# Patient Record
Sex: Male | Born: 1940 | Race: White | Hispanic: No | Smoking: Never smoker
Health system: Southern US, Community
[De-identification: ages and names within clinical notes are randomized; demographics above are authoritative.]

## PROBLEM LIST (undated history)

## (undated) DIAGNOSIS — I10 Essential (primary) hypertension: Secondary | ICD-10-CM

## (undated) DIAGNOSIS — R7303 Prediabetes: Secondary | ICD-10-CM

## (undated) DIAGNOSIS — C801 Malignant (primary) neoplasm, unspecified: Secondary | ICD-10-CM

## (undated) DIAGNOSIS — H269 Unspecified cataract: Secondary | ICD-10-CM

## (undated) DIAGNOSIS — C61 Malignant neoplasm of prostate: Secondary | ICD-10-CM

## (undated) DIAGNOSIS — E785 Hyperlipidemia, unspecified: Secondary | ICD-10-CM

## (undated) HISTORY — DX: Essential (primary) hypertension: I10

## (undated) HISTORY — DX: Malignant neoplasm of prostate: C61

## (undated) HISTORY — DX: Malignant (primary) neoplasm, unspecified: C80.1

## (undated) HISTORY — PX: PROSTATE SURGERY: SHX751

## (undated) HISTORY — PX: POLYPECTOMY: SHX149

## (undated) HISTORY — PX: COLONOSCOPY: SHX174

## (undated) HISTORY — PX: OTHER SURGICAL HISTORY: SHX169

---

## 1997-12-02 ENCOUNTER — Emergency Department (HOSPITAL_COMMUNITY): Admission: EM | Admit: 1997-12-02 | Discharge: 1997-12-02 | Payer: Self-pay | Admitting: Emergency Medicine

## 1999-08-11 ENCOUNTER — Other Ambulatory Visit: Admission: RE | Admit: 1999-08-11 | Discharge: 1999-08-11 | Payer: Self-pay | Admitting: Urology

## 2000-08-16 ENCOUNTER — Other Ambulatory Visit: Admission: RE | Admit: 2000-08-16 | Discharge: 2000-08-16 | Payer: Self-pay | Admitting: Urology

## 2000-08-20 ENCOUNTER — Encounter: Admission: RE | Admit: 2000-08-20 | Discharge: 2000-11-18 | Payer: Self-pay | Admitting: Radiation Oncology

## 2000-11-30 ENCOUNTER — Encounter: Payer: Self-pay | Admitting: Urology

## 2000-12-07 ENCOUNTER — Encounter (INDEPENDENT_AMBULATORY_CARE_PROVIDER_SITE_OTHER): Payer: Self-pay | Admitting: Specialist

## 2000-12-07 ENCOUNTER — Inpatient Hospital Stay (HOSPITAL_COMMUNITY): Admission: RE | Admit: 2000-12-07 | Discharge: 2000-12-10 | Payer: Self-pay | Admitting: Urology

## 2006-10-22 ENCOUNTER — Ambulatory Visit: Payer: Self-pay | Admitting: Gastroenterology

## 2006-11-05 ENCOUNTER — Ambulatory Visit: Payer: Self-pay | Admitting: Gastroenterology

## 2006-11-05 ENCOUNTER — Encounter: Payer: Self-pay | Admitting: Gastroenterology

## 2010-04-28 ENCOUNTER — Ambulatory Visit (HOSPITAL_COMMUNITY)
Admission: RE | Admit: 2010-04-28 | Discharge: 2010-04-29 | Payer: Self-pay | Source: Home / Self Care | Admitting: Surgery

## 2010-08-12 LAB — CBC
HCT: 41.9 % (ref 39.0–52.0)
Hemoglobin: 14.7 g/dL (ref 13.0–17.0)
MCH: 31.4 pg (ref 26.0–34.0)
MCHC: 35.2 g/dL (ref 30.0–36.0)
MCV: 89.3 fL (ref 78.0–100.0)
Platelets: 273 10*3/uL (ref 150–400)
RBC: 4.69 MIL/uL (ref 4.22–5.81)
RDW: 13.4 % (ref 11.5–15.5)
WBC: 14.9 10*3/uL — ABNORMAL HIGH (ref 4.0–10.5)

## 2010-08-12 LAB — BASIC METABOLIC PANEL
BUN: 16 mg/dL (ref 6–23)
CO2: 24 mEq/L (ref 19–32)
Calcium: 9.1 mg/dL (ref 8.4–10.5)
Chloride: 102 mEq/L (ref 96–112)
Creatinine, Ser: 1.15 mg/dL (ref 0.4–1.5)
GFR calc Af Amer: >60 mL/min (ref >60)
GFR calc non Af Amer: >60 mL/min (ref >60)
Glucose, Bld: 109 mg/dL — ABNORMAL HIGH (ref 70–99)
Potassium: 4 mEq/L (ref 3.5–5.1)
Sodium: 136 mEq/L (ref 135–145)

## 2010-08-12 LAB — CULTURE, ROUTINE-ABSCESS

## 2010-08-12 LAB — SURGICAL PCR SCREEN
MRSA, PCR: NEGATIVE
Staphylococcus aureus: NEGATIVE

## 2010-10-17 NOTE — Op Note (Signed)
[facility]  Patient:    Theodore Dominguez, Theodore Dominguez                          MRN: [id number] Proc. Date: 12/07/00 Adm. Date:  [id number] Attending:  Nelma Rothman Dominguez CC:         Theodore Dominguez, M.D.  Theodore Dominguez, M.D.   Operative Report  PREOPERATIVE DIAGNOSIS:  Adenocarcinoma of the prostate.  POSTOPERATIVE DIAGNOSIS:  Adenocarcinoma of the prostate.  OPERATION:  Radical retropubic prostatectomy, potency sparing.  SURGEON:  Theodore Dominguez, M.D.  ASSISTANT:  Theodore Dominguez, M.D.  ANESTHESIA:  General endotracheal anesthesia.  ESTIMATED BLOOD LOSS: 500 cc.  TUBES:  23-French 5 cc Ralene Ok, large flat Blake drain.  COMPLICATIONS:  None.  INDICATIONS FOR PROCEDURE:  Theodore Dominguez is a very nice 70 year old white male who presented with elevated PSA of 4.58 with a free-to-total ratio of 23.14%. He subsequently underwent biopsies which revealed some atypical glands and high-grade pin, and a repeat biopsy on August 16, 2000, revealed focal adenocarcinoma of the prostate, Gleason Score 6 which was 3+3 involving 15% of the right side of the prostate.  He has considered all options, met with Theodore Dominguez and Theodore Dominguez.  He has elected to undergo radical retropubic prostatectomy.   He full well understands the risks, benefits, and alternatives with risks including impotency, incontinence, rectal injury, bleeding, infection, and other potential problems.  He has thoroughly studied the illness and wishes to proceed.  PROCEDURE IN DETAIL:  The patient was placed in the supine position after proper general endotracheal anesthesia.  He was prepped and draped with Betadine in a sterile fashion.  A low midline abdominal incision was made. Sharp dissection was carried down through subcutaneous tissue.  The linea alba was incised in the direction of the muscle fibers.  The rectus muscle bellies were retracted laterally, and the space of Retzius was  entered.  No abnormal adenopathy was noted to be present.  Utilizing the [facility] retractor, bilateral pelvic lymphadenectomies were performed.   The external iliac, hypogastric, and obturator lymph nodes were serially dissected.   The proximal extend of the dissection were the ureters.  The distal extent were the circumflex iliac veins.  The obturator nerves were identified and protected, and lateral lymph nodes were avoided.  Following this, endopelvic fascia was perforated bilaterally, and the puboprostatic ligaments were incised sharply. The superficial dorsal vein of the penis was ligated proximally and distally with 3-0 chromic ligatures, cut, and the deep dorsal vein of the penis was encircled with a Hohenfeltner retractor and ligated with #1 Vicryl with a tie and subsequently a stick tie.  The dorsal vein complex was then incised sharply with Bovie coagulation cautery after an ampule of indigo carmine had bee given IV.  The pillars of the prostate were dissected down laterally after the lateral pelvic fascia had been sharply incised, allowing the neurovascular bundles to retract posteriorly and laterally.  Dissection was carried down to the apex.  The urethra was completely encircled with a Hohenfeltner retractor. An umbilical tape was placed, and the anterior urethra was incised sharply with a knife.  It might be noted that a 24-French 30 cc balloon Foley had been placed previously and the bladder drained.  The Foley was used as a traction device, and the posterior urethra was sharply incised with scissors down to the umbilical plane.  The rectourethralis muscles were then serially taken  down to the Denonvillier fascia which was dissected posteriorly.  The pedicles of the prostate were then approached and taken in packets and clipped with Hem-o-lok clips and taken to the posterior margin of the prostate.  The transversalis fascia was then incised.  The seminal vesicles and  ampulla of vas deferens were dissected free, and the bladder neck was then approached. It was incised sharply with Bovie coagulation cautery.  A large median bar was noted to be protruding into the bladder, and the ureteral orifices were identified with the help of the indigo carmine.  Sharp dissection as carried down around the bladder neck.  The entire prostate was excised and submitted to pathology.  The seminal vesicles were dissected out in totality along with ampulla of vas deferens, and this was submitted separately from the prostate. Irrigation was performed and good hemostasis noted to be present.  The mucosa of the bladder was plicated to the serosa with interrupted 5-0 chromic catgut, and a tennis racket type closure was performed with 3-0 chromic catgut, everting the well visualized ureteral orifices well within the bladder. Again, efflux of urine was noted after the closure of the bladder neck.  Over a 23-French 5 cc balloon catheter, the urethrovesicle anastomosis was then completed.  Stitches were placed in the 5 oclock, 7 oclock, 3 oclock, 9 oclock, and 12 oclock positions with 2-0 Vicryl suture utilizing UR5 needles, and then anastomosis was completed over it after a #1 Prolene had been placed through the eyelet of the catheter, exited, and sutured over a button as a safety stitch.  The bladder was thoroughly irrigated.  Good hemostasis was noted to be present.  There was no leakage of irrigation fluid. Again, irrigation was performed.  A large, flat Blake drain was placed through a left stab incision and sutured in place with nylon suture.  The rectus muscle bellies were approximated in the midline with interrupted 0 chromic catgut.  The fascia was closed with a #1 running PDS suture.  Irrigation of the subcutaneous tissue was performed, and the skin was closed with skin staples and dressed sterilely.  The patient tolerated the procedure well with no complications.  He was  taken to the recovery room in stable condition.                                                                             y DD:  12/07/00 TD:  12/07/00 Job: [id number]

## 2010-10-17 NOTE — Discharge Summary (Signed)
[facility]  Patient:    Theodore Dominguez, Theodore Dominguez                          MRN: [id number] Adm. Date:  [id number] Disc. Date: [id number] Attending:  Nelma Rothman Iii CC:         Claudette Laws, M.D.   Discharge Summary  DIAGNOSIS:  Adenocarcinoma of the prostate.  OPERATIVE PROCEDURE:  Radical retropubic prostatectomy on December 07, 2000.  HISTORY OF PRESENT ILLNESS:  Theodore Dominguez is a very nice 70 year old white male who presented with an elevated PSA of 4.58.  He subsequently underwent transrectal and biopsy of the prostate which revealed a Gleason score 6 adenocarcinoma on the right side.  He has considered all options and elected to undergo radical retropubic prostatectomy.  Past medical history, social history, family history, and review of systems: Please see history and physical in full detail.  PHYSICAL EXAMINATION:  VITAL SIGNS:  He is afebrile, vital signs stable.  GENERAL:  Well nourished, well developed, in no acute distress, oriented x 3.  HEENT:  Head, ear, nose, and throat PERRLA.  Ear, nose, and throat clear.  NECK:  Without mass or thyromegaly.  CHEST:  Clear anterior to posterior without rales or rhonchi.  ABDOMEN:  Soft, nontender, without mass or organomegaly.  HEART:  Normal sinus rhythm, without murmurs or gallops.  GU:  Penis, meatus, scrotum, testicle, adnexa, anus, and perineum were normal. Rectal vault is empty.  Sphincter tone is normal.  Prostate is 30 g, diffusely firm.  EXTREMITIES:  Normal.  NEUROLOGIC:  Intact.  HOSPITAL COURSE:  Patient was admitted.  After undergoing proper preoperative evaluation, he was subsequently taken to surgery on December 07, 2000, and underwent radical retropubic prostatectomy uneventfully.  Postoperatively, on December 07, 2000, he was doing very well and, on December 08, 2000, was afebrile, vital signs stable.  Blake output was modest.  Hemoglobin was 11.1, hematocrit 32.1, and white blood cell count  was 13.0.  BMET was essentially normal except for a low potassium of 2.4 and he was given Joyce Gross Ciel IV and p.o.  By December 09, 2000, he was tolerating clear liquids, bowel movements were noted, abdomen was soft.  He was increased to a 4 g sodium diet and subsequently discharged on December 10, 2000.  His potassium was 3.4.  Pathology was discussed in detail.  He was tolerating a diet, having bowel movements.  Discharge condition was improved.  Discharge medications were Cipro and Vicodin.  He was to follow up in a week and instructions were given. DD:  12/28/00 TD:  12/29/00 Job: [id number]

## 2010-10-17 NOTE — H&P (Signed)
[facility]  Patient:    Theodore Dominguez, Theodore Dominguez                          MRN: [id number] Adm. Date:  [id number] Attending:  Nelma Rothman Iii CC:         Theodore Dominguez, M.D.   History and Physical  CHIEF COMPLAINT:  I have prostate cancer.  HISTORY OF PRESENT ILLNESS:  Theodore Dominguez is a very nice 70 year old white male who was found to have an elevated PSA of 4.58, 23.14% free to total ratio.  He underwent biopsy of the prostate originally which revealed some atypical glands and high grade PIN.  Subsequent biopsy on August 16, 2000 revealed focal adenocarcinoma of the prostate Gleason score 6 which was 3+3 involving 15% of the right side of the prostate.  He has considered all options carefully, has met with Dr. Kathrin Greathouse, radiotherapist, and has elected to proceed with radical retropubic prostatectomy.  ALLERGIES:  No known drug allergies.  MEDICATIONS: 1. Maxzide 25 mg p.o. q.d. 2. Aspirin one q.d. but last dose was November 29, 2000.  PAST MEDICAL HISTORY:  Hypertension.  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  PAST SURGICAL HISTORY:  None previously.  SOCIAL HISTORY:  Negative smoker.  Negative drinker.  FAMILY HISTORY:  Essentially nonpertinent, although there is some high blood pressure and a distant history of prostate cancer.  REVIEW OF SYSTEMS:  He has no shortness of breath, dyspnea with exertion, chest pain, or GI complaints.  He has had some urgency with urination, but really has decent flow.  PHYSICAL EXAMINATION  VITAL SIGNS:  Blood pressure 120/90, pulse 62, respirations 16, temperature 97.5 degrees Fahrenheit.  GENERAL:  Well-nourished, well-developed, in no acute distress, oriented x 3.  HEENT:  PERRLA.  Nose and throat:  Clear.  NECK:  Without masses or thyromegaly or bruits.  CHEST:  Clear anterior and posterior without rales or rhonchi.  HEART:  Normal sinus rhythm without murmurs or  gallops.  ABDOMEN:  Soft, nontender without masses, organomegaly, or hernias.  GENITOURINARY:  Penis, meatus, scrotum, testicles, adnexa, anus, and perineum are normal.  RECTAL:  Vault is empty.  Sphincter tone is normal.  No rectal masses are palpable.  Prostate is approximately 30 g, diffusely slightly firm, but essentially benign.  EXTREMITIES:  Normal.  NEUROLOGIC:  Intact.  IMPRESSION:  Clinical stage T1 C Gleason score 6 adenocarcinoma of the prostate.  PLAN:  Radical retropubic prostatectomy. DD:  12/07/00 TD:  12/07/00 Job: [id number]

## 2011-09-15 ENCOUNTER — Encounter: Payer: Self-pay | Admitting: Gastroenterology

## 2011-09-25 ENCOUNTER — Encounter: Payer: Self-pay | Admitting: Gastroenterology

## 2011-10-27 ENCOUNTER — Ambulatory Visit (AMBULATORY_SURGERY_CENTER): Payer: BC Managed Care – PPO

## 2011-10-27 VITALS — Ht 70.0 in | Wt 192.6 lb

## 2011-10-27 DIAGNOSIS — Z86010 Personal history of colonic polyps: Secondary | ICD-10-CM

## 2011-10-27 DIAGNOSIS — Z1211 Encounter for screening for malignant neoplasm of colon: Secondary | ICD-10-CM

## 2011-10-27 MED ORDER — PEG-KCL-NACL-NASULF-NA ASC-C 100 G PO SOLR: 1.0000 | Freq: Once | ORAL | Status: AC

## 2011-10-28 ENCOUNTER — Encounter: Payer: Self-pay | Admitting: Gastroenterology

## 2011-11-10 ENCOUNTER — Encounter: Payer: Self-pay | Admitting: Gastroenterology

## 2011-11-10 ENCOUNTER — Ambulatory Visit (AMBULATORY_SURGERY_CENTER): Payer: BC Managed Care – PPO | Admitting: Gastroenterology

## 2011-11-10 VITALS — BP 153/72 | HR 67 | Temp 97.5°F | Resp 29 | Ht 70.0 in | Wt 192.0 lb

## 2011-11-10 DIAGNOSIS — Z860100 Personal history of colon polyps, unspecified: Secondary | ICD-10-CM

## 2011-11-10 DIAGNOSIS — D126 Benign neoplasm of colon, unspecified: Secondary | ICD-10-CM

## 2011-11-10 DIAGNOSIS — Z1211 Encounter for screening for malignant neoplasm of colon: Secondary | ICD-10-CM

## 2011-11-10 MED ORDER — SODIUM CHLORIDE 0.9 % IV SOLN: 500.0000 mL | INTRAVENOUS | Status: DC

## 2011-11-10 NOTE — Progress Notes (Signed)
Patient did not have preoperative order for IV antibiotic SSI prophylaxis. (G8918)  Patient did not experience any of the following events: a burn prior to discharge; a fall within the facility; wrong site/side/patient/procedure/implant event; or a hospital transfer or hospital admission upon discharge from the facility. (G8907)  

## 2011-11-10 NOTE — Op Note (Signed)
[facility] [address] [place], [place]  [address]  COLONOSCOPY PROCEDURE REPORT  PATIENT:  Theodore Dominguez, Theodore Dominguez  MR#:  [id number] BIRTHDATE:  [birth year], 71 yrs. old  GENDER:  male ENDOSCOPIST:  Barbette Hair. Arlyce Dice, MD REF. BY:  Carolin Coy, M.D. PROCEDURE DATE:  11/10/2011 PROCEDURE:  Colonoscopy with snare polypectomy, Colon with cold biopsy polypectomy ASA CLASS:  Class II INDICATIONS:  Screening, history of pre-cancerous (adenomatous) colon polyps Index polypectomy 2008 MEDICATIONS:   MAC sedation, administered by CRNA propofol 200mg IV  DESCRIPTION OF PROCEDURE:   After the risks benefits and alternatives of the procedure were thoroughly explained, informed consent was obtained.  Digital rectal exam was performed and revealed no abnormalities.   The LB [id number]-[id number] endoscope was introduced through the anus and advanced to the cecum, which was identified by both the appendix and ileocecal valve, without limitations.  The quality of the prep was excellent, using MoviPrep.  The instrument was then slowly withdrawn as the colon was fully examined. <<PROCEDUREIMAGES>>  FINDINGS:  A sessile polyp was found in the cecum. It was 6 mm in size. Polyp was snared without cautery. Retrieval was successful (see image2). snare polyp  A sessile polyp was found in the cecum. It was 2 mm in size. The polyp was removed using cold biopsy forceps (see image6).  A sessile polyp was found in the sigmoid colon. It was 3 mm in size. It was found 15 cm from the point of entry. Polyp was snared without cautery. Retrieval was successful (see image6). snare polyp  This was otherwise a normal examination of the colon (see image5 and image7).   Retroflexed views in the rectum revealed no abnormalities.    The time to cecum =  1) 3.25 minutes. The scope was then withdrawn in  1) 10.0  minutes from the cecum and the procedure completed. COMPLICATIONS:  None ENDOSCOPIC IMPRESSION: 1) 6 mm  sessile polyp in the cecum 2) 2 mm sessile polyp in the cecum 3) 3 mm sessile polyp in the sigmoid colon 4) Otherwise normal examination RECOMMENDATIONS: 1) If the polyp(s) removed today are proven to be adenomatous (pre-cancerous) polyps, you will need a repeat colonoscopy in 5 years. Otherwise you should continue to follow colorectal cancer screening guidelines for "routine risk" patients with colonoscopy in 10 years. You will receive a letter within 1-2 weeks with the results of your biopsy as well as final recommendations. Please call my office if you have not received a letter after 3 weeks. REPEAT EXAM:   You will receive a letter from Dr. Arlyce Dice in 1-2 weeks, after reviewing the final pathology, with followup recommendations.  ______________________________ Barbette Hair Arlyce Dice, MD  CC:  n. eSIGNED:   Barbette Hair. Rowan Pollman at 11/10/2011 10:13 AM  Myna Bright, [id number]

## 2011-11-10 NOTE — Patient Instructions (Signed)
YOU HAD AN ENDOSCOPIC PROCEDURE TODAY AT THE [facility]: Refer to the procedure report that was given to you for any specific questions about what was found during the examination.  If the procedure report does not answer your questions, please call your gastroenterologist to clarify.  If you requested that your care partner not be given the details of your procedure findings, then the procedure report has been included in a sealed envelope for you to review at your convenience later.  YOU SHOULD EXPECT: Some feelings of bloating in the abdomen. Passage of more gas than usual.  Walking can help get rid of the air that was put into your GI tract during the procedure and reduce the bloating. If you had a lower endoscopy (such as a colonoscopy or flexible sigmoidoscopy) you may notice spotting of blood in your stool or on the toilet paper. If you underwent a bowel prep for your procedure, then you may not have a normal bowel movement for a few days.  DIET: Your first meal following the procedure should be a light meal and then it is ok to progress to your normal diet.  A half-sandwich or bowl of soup is an example of a good first meal.  Heavy or fried foods are harder to digest and may make you feel nauseous or bloated.  Likewise meals heavy in dairy and vegetables can cause extra gas to form and this can also increase the bloating.  Drink plenty of fluids but you should avoid alcoholic beverages for 24 hours.  ACTIVITY: Your care partner should take you home directly after the procedure.  You should plan to take it easy, moving slowly for the rest of the day.  You can resume normal activity the day after the procedure however you should NOT DRIVE or use heavy machinery for 24 hours (because of the sedation medicines used during the test).    SYMPTOMS TO REPORT IMMEDIATELY: A gastroenterologist can be reached at any hour.  During normal business hours, 8:30 AM to 5:00 PM Monday through Friday,  call [phone].  After hours and on weekends, please call the GI answering service at [phone] who will take a message and have the physician on call contact you.   Following lower endoscopy (colonoscopy or flexible sigmoidoscopy):  Excessive amounts of blood in the stool  Significant tenderness or worsening of abdominal pains  Swelling of the abdomen that is new, acute  Fever of 100F or higher    FOLLOW UP: If any biopsies were taken you will be contacted by phone or by letter within the next 1-3 weeks.  Call your gastroenterologist if you have not heard about the biopsies in 3 weeks.  Our staff will call the home number listed on your records the next business day following your procedure to check on you and address any questions or concerns that you may have at that time regarding the information given to you following your procedure. This is a courtesy call and so if there is no answer at the home number and we have not heard from you through the emergency physician on call, we will assume that you have returned to your regular daily activities without incident.  SIGNATURES/CONFIDENTIALITY: You and/or your care partner have signed paperwork which will be entered into your electronic medical record.  These signatures attest to the fact that that the information above on your After Visit Summary has been reviewed and is understood.  Full responsibility of the confidentiality   of this discharge information lies with you and/or your care-partner.     

## 2011-11-11 ENCOUNTER — Telehealth: Payer: Self-pay

## 2011-11-11 NOTE — Telephone Encounter (Signed)
Request not to leave message on answering machine.

## 2011-11-16 ENCOUNTER — Encounter: Payer: Self-pay | Admitting: Gastroenterology

## 2013-07-10 DIAGNOSIS — Z23 Encounter for immunization: Secondary | ICD-10-CM | POA: Diagnosis not present

## 2013-07-10 DIAGNOSIS — C61 Malignant neoplasm of prostate: Secondary | ICD-10-CM | POA: Diagnosis not present

## 2013-07-10 DIAGNOSIS — Z0000 Encounter for general adult medical examination without abnormal findings: Secondary | ICD-10-CM | POA: Diagnosis not present

## 2013-07-10 DIAGNOSIS — E7800 Pure hypercholesterolemia, unspecified: Secondary | ICD-10-CM | POA: Diagnosis not present

## 2013-07-10 DIAGNOSIS — I10 Essential (primary) hypertension: Secondary | ICD-10-CM | POA: Diagnosis not present

## 2013-07-13 DIAGNOSIS — R7309 Other abnormal glucose: Secondary | ICD-10-CM | POA: Diagnosis not present

## 2013-07-20 DIAGNOSIS — R351 Nocturia: Secondary | ICD-10-CM | POA: Diagnosis not present

## 2013-07-20 DIAGNOSIS — Z8546 Personal history of malignant neoplasm of prostate: Secondary | ICD-10-CM | POA: Diagnosis not present

## 2013-07-20 DIAGNOSIS — N529 Male erectile dysfunction, unspecified: Secondary | ICD-10-CM | POA: Diagnosis not present

## 2013-08-15 DIAGNOSIS — Z9109 Other allergy status, other than to drugs and biological substances: Secondary | ICD-10-CM | POA: Diagnosis not present

## 2013-09-12 DIAGNOSIS — L84 Corns and callosities: Secondary | ICD-10-CM | POA: Diagnosis not present

## 2013-09-12 DIAGNOSIS — S91309A Unspecified open wound, unspecified foot, initial encounter: Secondary | ICD-10-CM | POA: Diagnosis not present

## 2013-09-13 DIAGNOSIS — Z0000 Encounter for general adult medical examination without abnormal findings: Secondary | ICD-10-CM | POA: Diagnosis not present

## 2013-09-13 DIAGNOSIS — S91309A Unspecified open wound, unspecified foot, initial encounter: Secondary | ICD-10-CM | POA: Diagnosis not present

## 2013-09-14 DIAGNOSIS — M205X9 Other deformities of toe(s) (acquired), unspecified foot: Secondary | ICD-10-CM | POA: Diagnosis not present

## 2013-09-14 DIAGNOSIS — L97509 Non-pressure chronic ulcer of other part of unspecified foot with unspecified severity: Secondary | ICD-10-CM | POA: Diagnosis not present

## 2013-09-14 DIAGNOSIS — E1169 Type 2 diabetes mellitus with other specified complication: Secondary | ICD-10-CM | POA: Diagnosis not present

## 2013-09-28 DIAGNOSIS — E1169 Type 2 diabetes mellitus with other specified complication: Secondary | ICD-10-CM | POA: Diagnosis not present

## 2013-09-28 DIAGNOSIS — M205X9 Other deformities of toe(s) (acquired), unspecified foot: Secondary | ICD-10-CM | POA: Diagnosis not present

## 2013-09-28 DIAGNOSIS — L97509 Non-pressure chronic ulcer of other part of unspecified foot with unspecified severity: Secondary | ICD-10-CM | POA: Diagnosis not present

## 2013-10-12 DIAGNOSIS — M205X9 Other deformities of toe(s) (acquired), unspecified foot: Secondary | ICD-10-CM | POA: Diagnosis not present

## 2013-10-12 DIAGNOSIS — E1169 Type 2 diabetes mellitus with other specified complication: Secondary | ICD-10-CM | POA: Diagnosis not present

## 2013-10-12 DIAGNOSIS — M2010 Hallux valgus (acquired), unspecified foot: Secondary | ICD-10-CM | POA: Diagnosis not present

## 2013-10-12 DIAGNOSIS — L97509 Non-pressure chronic ulcer of other part of unspecified foot with unspecified severity: Secondary | ICD-10-CM | POA: Diagnosis not present

## 2013-10-16 DIAGNOSIS — M205X9 Other deformities of toe(s) (acquired), unspecified foot: Secondary | ICD-10-CM | POA: Diagnosis not present

## 2013-10-16 DIAGNOSIS — M2010 Hallux valgus (acquired), unspecified foot: Secondary | ICD-10-CM | POA: Diagnosis not present

## 2013-10-16 DIAGNOSIS — E1169 Type 2 diabetes mellitus with other specified complication: Secondary | ICD-10-CM | POA: Diagnosis not present

## 2013-10-16 DIAGNOSIS — L97509 Non-pressure chronic ulcer of other part of unspecified foot with unspecified severity: Secondary | ICD-10-CM | POA: Diagnosis not present

## 2013-10-28 DIAGNOSIS — L97509 Non-pressure chronic ulcer of other part of unspecified foot with unspecified severity: Secondary | ICD-10-CM | POA: Diagnosis not present

## 2013-10-28 DIAGNOSIS — E1169 Type 2 diabetes mellitus with other specified complication: Secondary | ICD-10-CM | POA: Diagnosis not present

## 2013-11-27 ENCOUNTER — Emergency Department (HOSPITAL_COMMUNITY)
Admission: EM | Admit: 2013-11-27 | Discharge: 2013-11-27 | Disposition: A | Discharge status: Home / Self Care | Payer: Medicare Other | Attending: Emergency Medicine | Admitting: Emergency Medicine

## 2013-11-27 ENCOUNTER — Emergency Department (HOSPITAL_COMMUNITY): Payer: Medicare Other

## 2013-11-27 ENCOUNTER — Encounter (HOSPITAL_COMMUNITY): Payer: Self-pay | Admitting: Emergency Medicine

## 2013-11-27 DIAGNOSIS — L03119 Cellulitis of unspecified part of limb: Secondary | ICD-10-CM | POA: Diagnosis not present

## 2013-11-27 DIAGNOSIS — Z7982 Long term (current) use of aspirin: Secondary | ICD-10-CM | POA: Diagnosis not present

## 2013-11-27 DIAGNOSIS — L03039 Cellulitis of unspecified toe: Secondary | ICD-10-CM | POA: Diagnosis not present

## 2013-11-27 DIAGNOSIS — L03031 Cellulitis of right toe: Secondary | ICD-10-CM

## 2013-11-27 DIAGNOSIS — Z8546 Personal history of malignant neoplasm of prostate: Secondary | ICD-10-CM | POA: Diagnosis not present

## 2013-11-27 DIAGNOSIS — Z79899 Other long term (current) drug therapy: Secondary | ICD-10-CM | POA: Insufficient documentation

## 2013-11-27 DIAGNOSIS — M7989 Other specified soft tissue disorders: Secondary | ICD-10-CM | POA: Diagnosis not present

## 2013-11-27 DIAGNOSIS — I10 Essential (primary) hypertension: Secondary | ICD-10-CM | POA: Diagnosis not present

## 2013-11-27 DIAGNOSIS — L02619 Cutaneous abscess of unspecified foot: Secondary | ICD-10-CM | POA: Diagnosis not present

## 2013-11-27 LAB — CBC WITH DIFFERENTIAL/PLATELET
BASOS ABS: 0 10*3/uL (ref 0.0–0.1)
BASOS PCT: 0 % (ref 0–1)
Eosinophils Absolute: 0.5 10*3/uL (ref 0.0–0.7)
Eosinophils Relative: 5 % (ref 0–5)
HCT: 41.7 % (ref 39.0–52.0)
Hemoglobin: 14.2 g/dL (ref 13.0–17.0)
LYMPHS PCT: 30 % (ref 12–46)
Lymphs Abs: 3 10*3/uL (ref 0.7–4.0)
MCH: 29.2 pg (ref 26.0–34.0)
MCHC: 34.1 g/dL (ref 30.0–36.0)
MCV: 85.8 fL (ref 78.0–100.0)
MONO ABS: 0.8 10*3/uL (ref 0.1–1.0)
Monocytes Relative: 8 % (ref 3–12)
NEUTROS ABS: 5.7 10*3/uL (ref 1.7–7.7)
Neutrophils Relative %: 57 % (ref 43–77)
PLATELETS: 259 10*3/uL (ref 150–400)
RBC: 4.86 MIL/uL (ref 4.22–5.81)
RDW: 13.6 % (ref 11.5–15.5)
WBC: 10.1 10*3/uL (ref 4.0–10.5)

## 2013-11-27 LAB — BASIC METABOLIC PANEL
BUN: 19 mg/dL (ref 6–23)
CALCIUM: 9.7 mg/dL (ref 8.4–10.5)
CHLORIDE: 101 meq/L (ref 96–112)
CO2: 29 meq/L (ref 19–32)
CREATININE: 0.99 mg/dL (ref 0.50–1.35)
GFR calc non Af Amer: 79 mL/min — ABNORMAL LOW (ref >90)
Glucose, Bld: 99 mg/dL (ref 70–99)
Potassium: 3.9 mEq/L (ref 3.7–5.3)
SODIUM: 141 meq/L (ref 137–147)

## 2013-11-27 MED ORDER — CLINDAMYCIN HCL 150 MG PO CAPS: 300.0000 mg | ORAL_CAPSULE | Freq: Four times a day (QID) | ORAL | Status: DC

## 2013-11-27 NOTE — ED Notes (Signed)
Bed: AC16 Expected date:  Expected time:  Means of arrival:  Comments: Theodore Dominguez

## 2013-11-27 NOTE — ED Notes (Signed)
Pt went to podiatrist in April.  Callous which opened up.  Has had antibiotics.  Pt told it would take 2 weeks to heal.  4 weeks later, wound opened and oozing with blisters.  Not improving.

## 2013-11-27 NOTE — ED Notes (Signed)
Pt reports R foot callus came loose and had started to become sore.  Has been to see his doctor and was put on abx last week and seemed to get better.  Recently, his foot started to become worse and have developed blisters.  What appears to be a pressure ulcer is noted on pt's R medial foot.  Redness and swelling noted.

## 2013-11-27 NOTE — ED Provider Notes (Signed)
CSN: [id number]     Arrival date & time 11/27/13  1230 History   First MD Initiated Contact with Patient 11/27/13 1549     Chief Complaint  Patient presents with  . Foot Swelling     (Consider location/radiation/quality/duration/timing/severity/associated sxs/prior Treatment) HPI Comments: Patient presents emergency department with chief complaint of right foot ulceration and rash. He states that he has been seeing a podiatrist for the past several months. He states that he had a callus, which opened up, and has been being treated with clindamycin and levofloxacin. He states that antibiotics helped, but did not completely alleviate his symptoms. He states that this morning when he awoke, he noticed increased swelling, blistering, and discharge to the foot. He states that the foot is nontender. He is able to ambulate appropriately. He denies any fevers, chills, nausea, or vomiting. He has not have a history of diabetes.  The history is provided by the patient. No language interpreter was used.    Past Medical History  Diagnosis Date  . Hypertension   . Cancer   . Prostate cancer     2002   Past Surgical History  Procedure Laterality Date  . Prostate surgery    . Colonoscopy    . Polypectomy    . Cyst on head      back of head/ 2011   Family History  Problem Relation Age of Onset  . Colon cancer Neg Hx    History  Substance Use Topics  . Smoking status: Never Smoker   . Smokeless tobacco: Never Used  . Alcohol Use: No    Review of Systems  All other systems reviewed and are negative.     Allergies  Review of patient's allergies indicates no known allergies.  Home Medications   Prior to Admission medications   Medication Sig Start Date End Date Taking? Authorizing Deara Bober  Ascorbic Acid (VITAMIN C) 1000 MG tablet Take 1,000 mg by mouth daily.    Yes Historical Tahjai Schetter, MD  aspirin 81 MG tablet Take 81 mg by mouth daily.   Yes Historical Treesa Mccully, MD   calcium-vitamin D (OSCAL WITH D) 500-200 MG-UNIT per tablet Take 2 tablets by mouth daily with breakfast.   Yes Historical Zephaniah Enyeart, MD  fish oil-omega-3 fatty acids 1000 MG capsule Take 2 g by mouth daily.   Yes Historical Elecia Serafin, MD  lisinopril-hydrochlorothiazide (PRINZIDE,ZESTORETIC) 20-25 MG per tablet Take 1 tablet by mouth daily.   Yes Historical Maleeyah Mccaughey, MD  metoprolol succinate (TOPROL-XL) 50 MG 24 hr tablet Take 50 mg by mouth daily. Take with or immediately following a meal.   Yes Historical Renso Swett, MD  Multiple Vitamin (MULTIVITAMIN) tablet Take 1 tablet by mouth daily. Senior MVI-Take one daily   Yes Historical Rosie Torrez, MD  vitamin E 400 UNIT capsule Take 400 Units by mouth daily.   Yes Historical Kabrea Seeney, MD   BP 153/83  Pulse 60  Temp(Src) 97.7 F (36.5 C) (Oral)  Resp 18  SpO2 100% Physical Exam  Nursing note and vitals reviewed. Constitutional: He is oriented to person, place, and time. He appears well-developed and well-nourished.  HENT:  Head: Normocephalic and atraumatic.  Eyes: Conjunctivae and EOM are normal. Pupils are equal, round, and reactive to light. Right eye exhibits no discharge. Left eye exhibits no discharge. No scleral icterus.  Neck: Normal range of motion. Neck supple. No JVD present.  Cardiovascular: Normal rate, regular rhythm, normal heart sounds and intact distal pulses.  Exam reveals no gallop and no friction rub.  No murmur heard. Intact distal pulses with brisk capillary refill  Pulmonary/Chest: Effort normal and breath sounds normal. No respiratory distress. He has no wheezes. He has no rales. He exhibits no tenderness.  Abdominal: Soft. He exhibits no distension and no mass. There is no tenderness. There is no rebound and no guarding.  Musculoskeletal: Normal range of motion. He exhibits no edema and no tenderness.  Range of motion and strength 5/5  Neurological: He is alert and oriented to person, place, and time.  Sensation intact   Skin: Skin is warm and dry.  Right foot remarkable for a 2 x 2 centimeter ulceration along the medial great toe, with surrounding erythema, blistering, and moderate discharge  Psychiatric: He has a normal mood and affect. His behavior is normal. Judgment and thought content normal.    ED Course  Procedures (including critical care time) Results for orders placed during the hospital encounter of 11/27/13  CBC WITH DIFFERENTIAL      Result Value Ref Range   WBC 10.1  4.0 - 10.5 K/uL   RBC 4.86  4.22 - 5.81 MIL/uL   Hemoglobin 14.2  13.0 - 17.0 g/dL   HCT 41.7  39.0 - 52.0 %   MCV 85.8  78.0 - 100.0 fL   MCH 29.2  26.0 - 34.0 pg   MCHC 34.1  30.0 - 36.0 g/dL   RDW 13.6  11.5 - 15.5 %   Platelets 259  150 - 400 K/uL   Neutrophils Relative % 57  43 - 77 %   Neutro Abs 5.7  1.7 - 7.7 K/uL   Lymphocytes Relative 30  12 - 46 %   Lymphs Abs 3.0  0.7 - 4.0 K/uL   Monocytes Relative 8  3 - 12 %   Monocytes Absolute 0.8  0.1 - 1.0 K/uL   Eosinophils Relative 5  0 - 5 %   Eosinophils Absolute 0.5  0.0 - 0.7 K/uL   Basophils Relative 0  0 - 1 %   Basophils Absolute 0.0  0.0 - 0.1 K/uL  BASIC METABOLIC PANEL      Result Value Ref Range   Sodium 141  137 - 147 mEq/L   Potassium 3.9  3.7 - 5.3 mEq/L   Chloride 101  96 - 112 mEq/L   CO2 29  19 - 32 mEq/L   Glucose, Bld 99  70 - 99 mg/dL   BUN 19  6 - 23 mg/dL   Creatinine, Ser 0.99  0.50 - 1.35 mg/dL   Calcium 9.7  8.4 - 10.5 mg/dL   GFR calc non Af Amer 79 (*) >90 mL/min   GFR calc Af Amer >90  >90 mL/min   Dg Foot Complete Right  11/27/2013   CLINICAL DATA:  Foot swelling with nonhealing wound of the distal first metatarsal  EXAM: RIGHT FOOT COMPLETE - 3+ VIEW  COMPARISON:  None.  FINDINGS: There is no evidence of fracture or dislocation. There is soft tissue ulceration and swelling of the medial aspect of the first metatarsal phalangeal joint area. There is no underlying bony destruction to suggest osteomyelitis. Calcification at the  Achilles tendon insertion to the calcaneus is noted.  IMPRESSION: There is soft tissue ulceration and swelling of the medial aspect of the first metatarsal phalangeal joint area. There is no underlying bony destruction to suggest osteomyelitis.   Electronically Signed   By: Abelardo Diesel M.D.   On: 11/27/2013 14:41     Imaging Review Dg Foot Complete  Right  11/27/2013   CLINICAL DATA:  Foot swelling with nonhealing wound of the distal first metatarsal  EXAM: RIGHT FOOT COMPLETE - 3+ VIEW  COMPARISON:  None.  FINDINGS: There is no evidence of fracture or dislocation. There is soft tissue ulceration and swelling of the medial aspect of the first metatarsal phalangeal joint area. There is no underlying bony destruction to suggest osteomyelitis. Calcification at the Achilles tendon insertion to the calcaneus is noted.  IMPRESSION: There is soft tissue ulceration and swelling of the medial aspect of the first metatarsal phalangeal joint area. There is no underlying bony destruction to suggest osteomyelitis.   Electronically Signed   By: Abelardo Diesel M.D.   On: 11/27/2013 14:41     EKG Interpretation None      MDM   Final diagnoses:  Cellulitis of toe of right foot    Patient with cellulitis of right great. He was being treated with antibiotics, however this was approximately one month ago. Given the length of time between treatment, did not feel this is failing outpatient therapy, and we'll prescribe clindamycin and discharged home with wound recheck in 2 days. Patient seen by and discussed with Dr. Tawnya Crook, who agrees with the plan. Patient is stable and ready for discharge. He understands and agrees with plan as well.    Montine Circle, PA-C 11/27/13 1734

## 2013-11-27 NOTE — Discharge Instructions (Signed)
Cellulitis Cellulitis is an infection of the skin and the tissue beneath it. The infected area is usually red and tender. Cellulitis occurs most often in the arms and lower legs.  CAUSES  Cellulitis is caused by bacteria that enter the skin through cracks or cuts in the skin. The most common types of bacteria that cause cellulitis are Staphylococcus and Streptococcus. SYMPTOMS   Redness and warmth.  Swelling.  Tenderness or pain.  Fever. DIAGNOSIS  Your caregiver can usually determine what is wrong based on a physical exam. Blood tests may also be done. TREATMENT  Treatment usually involves taking an antibiotic medicine. HOME CARE INSTRUCTIONS   Take your antibiotics as directed. Finish them even if you start to feel better.  Keep the infected arm or leg elevated to reduce swelling.  Apply a warm cloth to the affected area up to 4 times per day to relieve pain.  Only take over-the-counter or prescription medicines for pain, discomfort, or fever as directed by your caregiver.  Keep all follow-up appointments as directed by your caregiver. SEEK MEDICAL CARE IF:   You notice red streaks coming from the infected area.  Your red area gets larger or turns dark in color.  Your bone or joint underneath the infected area becomes painful after the skin has healed.  Your infection returns in the same area or another area.  You notice a swollen bump in the infected area.  You develop new symptoms. SEEK IMMEDIATE MEDICAL CARE IF:   You have a fever.  You feel very sleepy.  You develop vomiting or diarrhea.  You have a general ill feeling (malaise) with muscle aches and pains. MAKE SURE YOU:   Understand these instructions.  Will watch your condition.  Will get help right away if you are not doing well or get worse. Document Released: 02/25/2005 Document Revised: 11/17/2011 Document Reviewed: 08/03/2011 [employer] Patient Information 2015 [employer]. This information is  not intended to replace advice given to you by your health care provider. Make sure you discuss any questions you have with your health care provider.  

## 2013-11-28 NOTE — ED Provider Notes (Signed)
Medical screening examination/treatment/procedure(s) were conducted as a shared visit with non-physician practitioner(s) and myself.  I personally evaluated the patient during the encounter.   EKG Interpretation None      Pt presents w/ inc redness, swelling, warmth of R foot. Was on abx 2 weeks ago for wound infection. No current systemic s/sx of infection including no fever, chills, nml WBC. I feel pt can be tried as an outpt w/ PO abx and can return to ED or PCP in 2 days for wound check.   Neta Ehlers, MD 11/28/13 [phone]

## 2013-11-30 ENCOUNTER — Emergency Department (HOSPITAL_COMMUNITY)
Admission: EM | Admit: 2013-11-30 | Discharge: 2013-11-30 | Disposition: A | Discharge status: Home / Self Care | Payer: Medicare Other | Attending: Emergency Medicine | Admitting: Emergency Medicine

## 2013-11-30 ENCOUNTER — Encounter (HOSPITAL_COMMUNITY): Payer: Self-pay | Admitting: Emergency Medicine

## 2013-11-30 DIAGNOSIS — Z9889 Other specified postprocedural states: Secondary | ICD-10-CM | POA: Insufficient documentation

## 2013-11-30 DIAGNOSIS — Z79899 Other long term (current) drug therapy: Secondary | ICD-10-CM | POA: Insufficient documentation

## 2013-11-30 DIAGNOSIS — L89892 Pressure ulcer of other site, stage 2: Secondary | ICD-10-CM

## 2013-11-30 DIAGNOSIS — Z859 Personal history of malignant neoplasm, unspecified: Secondary | ICD-10-CM | POA: Diagnosis not present

## 2013-11-30 DIAGNOSIS — Z792 Long term (current) use of antibiotics: Secondary | ICD-10-CM | POA: Diagnosis not present

## 2013-11-30 DIAGNOSIS — L8992 Pressure ulcer of unspecified site, stage 2: Secondary | ICD-10-CM | POA: Insufficient documentation

## 2013-11-30 DIAGNOSIS — L89899 Pressure ulcer of other site, unspecified stage: Secondary | ICD-10-CM | POA: Diagnosis not present

## 2013-11-30 DIAGNOSIS — I10 Essential (primary) hypertension: Secondary | ICD-10-CM | POA: Diagnosis not present

## 2013-11-30 DIAGNOSIS — Z8546 Personal history of malignant neoplasm of prostate: Secondary | ICD-10-CM | POA: Insufficient documentation

## 2013-11-30 DIAGNOSIS — Z7982 Long term (current) use of aspirin: Secondary | ICD-10-CM | POA: Insufficient documentation

## 2013-11-30 NOTE — ED Notes (Signed)
Pt coming in today for check up for his cellulitis on his right foot.  Pt was seen here on 11/27/13 where he was given antibiotics and still taking them.

## 2013-11-30 NOTE — ED Notes (Signed)
md at bedside  Pt alert and oriented x4. Respirations even and unlabored, bilateral symmetrical rise and fall of chest. Skin warm and dry. In no acute distress. Denies needs.   

## 2013-11-30 NOTE — ED Notes (Signed)
Pt escorted to discharge window. Pt verbalized understanding discharge instructions. In no acute distress.  

## 2013-11-30 NOTE — Discharge Instructions (Signed)
Continue your antibiotics.  Continue to do the epsin salt soaks 2 times a day and do wet to dry dressing 2 times a day on the deep wound.  Pressure Ulcer A pressure ulcer is a sore that has formed from the breakdown of skin and exposure of deeper layers of tissue. It develops in areas of the body where there is unrelieved pressure. Pressure ulcers are usually found over a bony area, such as the shoulder blades, spine, lower back, hips, knees, ankles, and heels. Pressure ulcers vary in severity. Your health care provider may determine the severity (stage) of your pressure ulcer. The stages include:  Stage I--The skin is red, and when the skin is pressed, it stays red.  Stage II--The top layer of skin is gone, and there is a shallow, pink ulcer.  Stage III--The ulcer becomes deeper, and it is more difficult to see the whole wound. Also, there may be yellow or brown parts, as well as pink and red parts.  Stage IV--The ulcer may be deep and red, pink, brown, white, or yellow. Bone or muscle may be seen.  Unstageable pressure ulcer--The ulcer is covered almost completely with black, brown, or yellow tissue. It is not known how deep the ulcer is or what stage it is until this covering comes off.  Suspected deep tissue injury--A person's skin can be injured from pressure or pulling on the skin when his or her position is changed. The skin appears purple or maroon. There may not be an opening in the skin, but there could be a blood-filled blister. This deep tissue injury is often difficult to see in people with darker skin tones. The site may open and become deeper in time. However, early interventions will help the area heal and may prevent the area from opening. CAUSES  Pressure ulcers are caused by pressure against the skin that limits the flow of blood to the skin and nearby tissues. There are many risk factors that can lead to pressure sores. RISK FACTORS  Decreased ability to move.  Decreased  ability to feel pain or discomfort.  Excessive skin moisture from urine, stool, sweat, or secretions.  Poor nutrition.  Dehydration.  Tobacco, drug, or alcohol abuse.  Having someone pull on bedsheets that are under you, such as when health care workers are changing your position in a hospital bed.  Obesity.  Increased adult age.  Hospitalization in a critical care unit for longer than 4 days with use of medical devices.  Prolonged use of medical devices.  Critical illness.  Anemia.  Traumatic brain injury.  Spinal cord injury.  Stroke.  Diabetes.  Poor blood glucose control.  Low blood pressure (hypotension).  Low oxygen levels.  Medicines that reduce blood flow.  Infection. DIAGNOSIS  Your health care provider will diagnose your pressure ulcer based on its appearance. The health care provider may determine the stage of your pressure ulcer as well. Tests may be done to check for infection, to assess your circulation, or to check for other diseases, such as diabetes. TREATMENT  Treatment of your pressure ulcer begins with determining what stage the ulcer is in. Your treatment team may include your health care provider, a wound care specialist, a nutritionist, a physical therapist, and a Psychologist, sport and exercise. Possible treatments may include:   Moving or repositioning every 1-2 hours.  Using beds or mattresses to shift your body weight and pressure points frequently.  Improving your diet.  Cleaning and bandaging (dressing) the open wound.  Giving antibiotic  medicines.  Removing damaged tissue.  Surgery and sometimes skin grafts. HOME CARE INSTRUCTIONS  If you were hospitalized, follow the care plan that was started in the hospital.  Avoid staying in the same position for more than 2 hours. Use padding, devices, or mattresses to cushion your pressure points as directed by your health care provider.  Eat a well-balanced diet. Take nutritional supplements and vitamins  as directed by your health care provider.  Keep all follow-up appointments.  Only take over-the-counter or prescription medicines for pain, fever, or discomfort as directed by your health care provider. SEEK MEDICAL CARE IF:   Your pressure ulcer is not improving.  You do not know how to care for your pressure ulcer.  You notice other areas of redness on your skin.  You have a fever. SEEK IMMEDIATE MEDICAL CARE IF:   You have increasing redness, swelling, or pain in your pressure ulcer.  You notice pus coming from your pressure ulcer.  You notice a bad smell coming from the wound or dressing.  Your pressure ulcer opens up again. Document Released: 05/18/2005 Document Revised: 05/23/2013 Document Reviewed: 01/23/2013 [facility] Patient Information 2015 [place], [place]. This information is not intended to replace advice given to you by your health care provider. Make sure you discuss any questions you have with your health care provider.

## 2013-11-30 NOTE — ED Provider Notes (Addendum)
CSN: [id number]     Arrival date & time 11/30/13  0735 History   First MD Initiated Contact with Patient 11/30/13 [phone]     Chief Complaint  Patient presents with  . Follow-up    rigth foot     (Consider location/radiation/quality/duration/timing/severity/associated sxs/prior Treatment) HPI Comments: Pt seen here 3 days ago for cellulitis and foot wound.  Placed on clinda and told to return for recheck.  Pt states redness, swelling and drainage has significantly improved.  Denies pain or fever.  Has been doing soaks with betadine and wearing a boot to avoid pressure on the foot  Patient is a 73 y.o. male presenting with wound check. The history is provided by the patient.  Wound Check This is a new problem. Episode onset: ongoing for about 1 month but seen 3 days ago. The problem occurs constantly. The problem has been gradually improving.    Past Medical History  Diagnosis Date  . Hypertension   . Cancer   . Prostate cancer     2002   Past Surgical History  Procedure Laterality Date  . Prostate surgery    . Colonoscopy    . Polypectomy    . Cyst on head      back of head/ 2011   Family History  Problem Relation Age of Onset  . Colon cancer Neg Hx    History  Substance Use Topics  . Smoking status: Never Smoker   . Smokeless tobacco: Never Used  . Alcohol Use: No    Review of Systems  All other systems reviewed and are negative.     Allergies  Review of patient's allergies indicates no known allergies.  Home Medications   Prior to Admission medications   Medication Sig Start Date End Date Taking? Authorizing Provider  Ascorbic Acid (VITAMIN C) 1000 MG tablet Take 1,000 mg by mouth daily.     Historical Provider, MD  aspirin 81 MG tablet Take 81 mg by mouth daily.    Historical Provider, MD  calcium-vitamin D (OSCAL WITH D) 500-200 MG-UNIT per tablet Take 2 tablets by mouth daily with breakfast.    Historical Provider, MD  clindamycin (CLEOCIN) 150 MG capsule  Take 2 capsules (300 mg total) by mouth 4 (four) times daily. May dispense as 150mg  capsules 11/27/13   Montine Circle, PA-C  fish oil-omega-3 fatty acids 1000 MG capsule Take 2 g by mouth daily.    Historical Provider, MD  lisinopril-hydrochlorothiazide (PRINZIDE,ZESTORETIC) 20-25 MG per tablet Take 1 tablet by mouth daily.    Historical Provider, MD  metoprolol succinate (TOPROL-XL) 50 MG 24 hr tablet Take 50 mg by mouth daily. Take with or immediately following a meal.    Historical Provider, MD  Multiple Vitamin (MULTIVITAMIN) tablet Take 1 tablet by mouth daily. Senior MVI-Take one daily    Historical Provider, MD  vitamin E 400 UNIT capsule Take 400 Units by mouth daily.    Historical Provider, MD   BP 129/65  Pulse 70  Temp(Src) 98.2 F (36.8 C) (Oral)  Resp 18  SpO2 98% Physical Exam  Nursing note and vitals reviewed. Constitutional: He appears well-developed and well-nourished. No distress.  HENT:  Head: Normocephalic and atraumatic.  Eyes: EOM are normal. Pupils are equal, round, and reactive to light.  Cardiovascular: Normal rate.   Pulmonary/Chest: Effort normal.  Musculoskeletal:       Feet:  Neurological: He is alert.  Psychiatric: He has a normal mood and affect. His behavior is normal.  ED Course  Procedures (including critical care time) Labs Review Labs Reviewed - No data to display  Imaging Review No results found.   EKG Interpretation None      MDM   Final diagnoses:  Decubitus ulcer of foot, stage II    Patient seen here 3 days ago started on clindamycin for chronic foot wound and surrounding cellulitis.  He returns today for recheck. He states the swelling, erythema and drainage has significantly improved. On exam he has a quarter-sized wound over his lateral MTP joint without tunneling or drainage.Marland Kitchen Healing blisters over the first second and third digits. Patient had imaging done when he was here that showed no signs of osteomyelitis. He has no  systemic symptoms and currently there is no overt signs of cellulitis or abscess. Encouraged patient to continue clindamycin. He is soaking his foot 2 times a day in Epsom salt and also recommended wet to dry dressings twice a day. Patient has an appointment with wound care on July 14. Patient was given strict instructions to return if symptoms worsened    Blanchie Dessert, MD 11/30/13 5809  Blanchie Dessert, MD 11/30/13 [phone]

## 2013-12-12 ENCOUNTER — Encounter (HOSPITAL_BASED_OUTPATIENT_CLINIC_OR_DEPARTMENT_OTHER): Payer: Medicare Other | Attending: General Surgery

## 2013-12-12 ENCOUNTER — Other Ambulatory Visit: Payer: Self-pay

## 2013-12-12 DIAGNOSIS — I10 Essential (primary) hypertension: Secondary | ICD-10-CM | POA: Insufficient documentation

## 2013-12-12 DIAGNOSIS — G5790 Unspecified mononeuropathy of unspecified lower limb: Secondary | ICD-10-CM | POA: Insufficient documentation

## 2013-12-12 DIAGNOSIS — Z79899 Other long term (current) drug therapy: Secondary | ICD-10-CM | POA: Insufficient documentation

## 2013-12-12 DIAGNOSIS — Z7982 Long term (current) use of aspirin: Secondary | ICD-10-CM | POA: Diagnosis not present

## 2013-12-12 DIAGNOSIS — L97509 Non-pressure chronic ulcer of other part of unspecified foot with unspecified severity: Secondary | ICD-10-CM | POA: Diagnosis not present

## 2013-12-12 DIAGNOSIS — L97809 Non-pressure chronic ulcer of other part of unspecified lower leg with unspecified severity: Secondary | ICD-10-CM | POA: Insufficient documentation

## 2013-12-12 DIAGNOSIS — D1630 Benign neoplasm of short bones of unspecified lower limb: Secondary | ICD-10-CM | POA: Diagnosis not present

## 2013-12-12 DIAGNOSIS — D2120 Benign neoplasm of connective and other soft tissue of unspecified lower limb, including hip: Secondary | ICD-10-CM | POA: Diagnosis not present

## 2013-12-13 NOTE — Progress Notes (Signed)
[facility]  NAME:  Theodore Dominguez, Theodore Dominguez NO.:  [id number]  MEDICAL RECORD NO.:  [id number]      DATE OF BIRTH:  [birth year]  PHYSICIAN:  Judene Companion, M.D.           VISIT DATE:                                  OFFICE VISIT   Theodore Dominguez is a first-time visitor at the [facility].  He comes to [place] with a complaint of an ulcer over his first MP joint of his right foot. 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  He does have a history of hypertension and his medicines include lisinopril, hydrochlorothiazide, metoprolol, and he also takes vitamins and aspirin.  When he checked in today, he had a blood pressure of 156/81, pulse 58, temperature 97.  He is 5 feet 9 inches, and weighs 180 pounds.  The ulcer on the first MP joint of his right foot goes right down to the periosteum and we got a good culture of some of this tissue of the periosteum.  He apparently has had an x-ray which did not show any osteo, but with this ulcer right down on the bone, I would argue that this is probably a false impression.  __________ area and to offload him, it is possible we may entertain the thoughts of hyperbaric oxygen and/or EZ cast.  We started him on doxycycline today and he will come back in a week.  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     Judene Companion, M.D.     PP/MEDQ  D:  12/12/2013  T:  12/13/2013  Job:  [id number]

## 2013-12-15 DIAGNOSIS — M722 Plantar fascial fibromatosis: Secondary | ICD-10-CM | POA: Diagnosis not present

## 2013-12-15 DIAGNOSIS — I10 Essential (primary) hypertension: Secondary | ICD-10-CM | POA: Diagnosis not present

## 2013-12-15 DIAGNOSIS — G5790 Unspecified mononeuropathy of unspecified lower limb: Secondary | ICD-10-CM | POA: Diagnosis not present

## 2013-12-15 DIAGNOSIS — Z79899 Other long term (current) drug therapy: Secondary | ICD-10-CM | POA: Diagnosis not present

## 2013-12-15 DIAGNOSIS — L97809 Non-pressure chronic ulcer of other part of unspecified lower leg with unspecified severity: Secondary | ICD-10-CM | POA: Diagnosis not present

## 2013-12-20 DIAGNOSIS — Z79899 Other long term (current) drug therapy: Secondary | ICD-10-CM | POA: Diagnosis not present

## 2013-12-20 DIAGNOSIS — G5790 Unspecified mononeuropathy of unspecified lower limb: Secondary | ICD-10-CM | POA: Diagnosis not present

## 2013-12-20 DIAGNOSIS — L97809 Non-pressure chronic ulcer of other part of unspecified lower leg with unspecified severity: Secondary | ICD-10-CM | POA: Diagnosis not present

## 2013-12-20 DIAGNOSIS — I10 Essential (primary) hypertension: Secondary | ICD-10-CM | POA: Diagnosis not present

## 2013-12-27 DIAGNOSIS — G5790 Unspecified mononeuropathy of unspecified lower limb: Secondary | ICD-10-CM | POA: Diagnosis not present

## 2013-12-27 DIAGNOSIS — I10 Essential (primary) hypertension: Secondary | ICD-10-CM | POA: Diagnosis not present

## 2013-12-27 DIAGNOSIS — L97809 Non-pressure chronic ulcer of other part of unspecified lower leg with unspecified severity: Secondary | ICD-10-CM | POA: Diagnosis not present

## 2013-12-27 DIAGNOSIS — Z79899 Other long term (current) drug therapy: Secondary | ICD-10-CM | POA: Diagnosis not present

## 2014-01-03 ENCOUNTER — Encounter (HOSPITAL_BASED_OUTPATIENT_CLINIC_OR_DEPARTMENT_OTHER): Payer: Medicare Other | Attending: General Surgery

## 2014-01-03 DIAGNOSIS — I872 Venous insufficiency (chronic) (peripheral): Secondary | ICD-10-CM | POA: Diagnosis not present

## 2014-01-03 DIAGNOSIS — L97509 Non-pressure chronic ulcer of other part of unspecified foot with unspecified severity: Secondary | ICD-10-CM | POA: Diagnosis not present

## 2014-01-10 DIAGNOSIS — L97509 Non-pressure chronic ulcer of other part of unspecified foot with unspecified severity: Secondary | ICD-10-CM | POA: Diagnosis not present

## 2014-01-10 DIAGNOSIS — I872 Venous insufficiency (chronic) (peripheral): Secondary | ICD-10-CM | POA: Diagnosis not present

## 2014-01-17 DIAGNOSIS — L97509 Non-pressure chronic ulcer of other part of unspecified foot with unspecified severity: Secondary | ICD-10-CM | POA: Diagnosis not present

## 2014-01-17 DIAGNOSIS — I872 Venous insufficiency (chronic) (peripheral): Secondary | ICD-10-CM | POA: Diagnosis not present

## 2014-01-24 DIAGNOSIS — L97509 Non-pressure chronic ulcer of other part of unspecified foot with unspecified severity: Secondary | ICD-10-CM | POA: Diagnosis not present

## 2014-01-24 DIAGNOSIS — I872 Venous insufficiency (chronic) (peripheral): Secondary | ICD-10-CM | POA: Diagnosis not present

## 2014-01-31 ENCOUNTER — Encounter (HOSPITAL_BASED_OUTPATIENT_CLINIC_OR_DEPARTMENT_OTHER): Payer: Medicare Other | Attending: General Surgery

## 2014-01-31 DIAGNOSIS — L97409 Non-pressure chronic ulcer of unspecified heel and midfoot with unspecified severity: Secondary | ICD-10-CM | POA: Diagnosis not present

## 2014-02-07 DIAGNOSIS — L97409 Non-pressure chronic ulcer of unspecified heel and midfoot with unspecified severity: Secondary | ICD-10-CM | POA: Diagnosis not present

## 2014-02-14 DIAGNOSIS — L97409 Non-pressure chronic ulcer of unspecified heel and midfoot with unspecified severity: Secondary | ICD-10-CM | POA: Diagnosis not present

## 2014-02-21 DIAGNOSIS — L97409 Non-pressure chronic ulcer of unspecified heel and midfoot with unspecified severity: Secondary | ICD-10-CM | POA: Diagnosis not present

## 2014-02-28 ENCOUNTER — Ambulatory Visit (HOSPITAL_COMMUNITY)
Admission: RE | Admit: 2014-02-28 | Discharge: 2014-02-28 | Disposition: A | Discharge status: Home / Self Care | Payer: Medicare Other | Source: Ambulatory Visit | Attending: General Surgery | Admitting: General Surgery

## 2014-02-28 ENCOUNTER — Other Ambulatory Visit (HOSPITAL_COMMUNITY): Payer: Self-pay | Admitting: General Surgery

## 2014-02-28 DIAGNOSIS — M7989 Other specified soft tissue disorders: Secondary | ICD-10-CM | POA: Insufficient documentation

## 2014-02-28 DIAGNOSIS — M79609 Pain in unspecified limb: Secondary | ICD-10-CM | POA: Diagnosis not present

## 2014-02-28 DIAGNOSIS — L97409 Non-pressure chronic ulcer of unspecified heel and midfoot with unspecified severity: Secondary | ICD-10-CM | POA: Diagnosis not present

## 2014-02-28 DIAGNOSIS — M869 Osteomyelitis, unspecified: Secondary | ICD-10-CM

## 2014-02-28 DIAGNOSIS — L97509 Non-pressure chronic ulcer of other part of unspecified foot with unspecified severity: Secondary | ICD-10-CM | POA: Diagnosis not present

## 2014-02-28 DIAGNOSIS — M948X9 Other specified disorders of cartilage, unspecified sites: Secondary | ICD-10-CM | POA: Insufficient documentation

## 2014-03-07 ENCOUNTER — Other Ambulatory Visit (HOSPITAL_COMMUNITY): Payer: Self-pay | Admitting: General Surgery

## 2014-03-07 ENCOUNTER — Encounter (HOSPITAL_BASED_OUTPATIENT_CLINIC_OR_DEPARTMENT_OTHER): Payer: Medicare Other | Attending: Internal Medicine

## 2014-03-07 DIAGNOSIS — M868X7 Other osteomyelitis, ankle and foot: Secondary | ICD-10-CM | POA: Insufficient documentation

## 2014-03-07 DIAGNOSIS — L03031 Cellulitis of right toe: Secondary | ICD-10-CM | POA: Insufficient documentation

## 2014-03-07 DIAGNOSIS — L97519 Non-pressure chronic ulcer of other part of right foot with unspecified severity: Secondary | ICD-10-CM | POA: Insufficient documentation

## 2014-03-07 DIAGNOSIS — G629 Polyneuropathy, unspecified: Secondary | ICD-10-CM | POA: Insufficient documentation

## 2014-03-07 DIAGNOSIS — R52 Pain, unspecified: Secondary | ICD-10-CM

## 2014-03-08 DIAGNOSIS — Z23 Encounter for immunization: Secondary | ICD-10-CM | POA: Diagnosis not present

## 2014-03-14 DIAGNOSIS — G629 Polyneuropathy, unspecified: Secondary | ICD-10-CM | POA: Diagnosis not present

## 2014-03-14 DIAGNOSIS — L03031 Cellulitis of right toe: Secondary | ICD-10-CM | POA: Diagnosis not present

## 2014-03-14 DIAGNOSIS — L97519 Non-pressure chronic ulcer of other part of right foot with unspecified severity: Secondary | ICD-10-CM | POA: Diagnosis not present

## 2014-03-14 DIAGNOSIS — M868X7 Other osteomyelitis, ankle and foot: Secondary | ICD-10-CM | POA: Diagnosis not present

## 2014-03-16 ENCOUNTER — Ambulatory Visit (HOSPITAL_COMMUNITY)
Admission: RE | Admit: 2014-03-16 | Discharge: 2014-03-16 | Disposition: A | Discharge status: Home / Self Care | Payer: Medicare Other | Source: Ambulatory Visit | Attending: General Surgery | Admitting: General Surgery

## 2014-03-16 DIAGNOSIS — M00871 Arthritis due to other bacteria, right ankle and foot: Secondary | ICD-10-CM | POA: Diagnosis not present

## 2014-03-16 DIAGNOSIS — M869 Osteomyelitis, unspecified: Secondary | ICD-10-CM | POA: Insufficient documentation

## 2014-03-16 DIAGNOSIS — M10071 Idiopathic gout, right ankle and foot: Secondary | ICD-10-CM | POA: Diagnosis not present

## 2014-03-16 DIAGNOSIS — M109 Gout, unspecified: Secondary | ICD-10-CM | POA: Diagnosis not present

## 2014-03-16 DIAGNOSIS — M868X7 Other osteomyelitis, ankle and foot: Secondary | ICD-10-CM | POA: Diagnosis not present

## 2014-03-16 DIAGNOSIS — M79671 Pain in right foot: Secondary | ICD-10-CM | POA: Diagnosis present

## 2014-03-16 DIAGNOSIS — R52 Pain, unspecified: Secondary | ICD-10-CM

## 2014-03-16 LAB — POCT I-STAT CREATININE: CREATININE: 1 mg/dL (ref 0.50–1.35)

## 2014-03-16 MED ORDER — GADOBENATE DIMEGLUMINE 529 MG/ML IV SOLN
20.0000 mL | Freq: Once | INTRAVENOUS | Status: AC | PRN
  Administered 2014-03-16: 18 mL via INTRAVENOUS

## 2014-03-19 ENCOUNTER — Other Ambulatory Visit (HOSPITAL_COMMUNITY): Payer: Medicare Other

## 2014-03-19 DIAGNOSIS — H43813 Vitreous degeneration, bilateral: Secondary | ICD-10-CM | POA: Diagnosis not present

## 2014-03-19 DIAGNOSIS — H2513 Age-related nuclear cataract, bilateral: Secondary | ICD-10-CM | POA: Diagnosis not present

## 2014-03-19 DIAGNOSIS — H52203 Unspecified astigmatism, bilateral: Secondary | ICD-10-CM | POA: Diagnosis not present

## 2014-03-19 DIAGNOSIS — H5213 Myopia, bilateral: Secondary | ICD-10-CM | POA: Diagnosis not present

## 2014-03-21 ENCOUNTER — Emergency Department (HOSPITAL_COMMUNITY)
Admission: EM | Admit: 2014-03-21 | Discharge: 2014-03-21 | Disposition: A | Discharge status: Home / Self Care | Payer: Medicare Other | Attending: Emergency Medicine | Admitting: Emergency Medicine

## 2014-03-21 ENCOUNTER — Encounter (HOSPITAL_COMMUNITY): Payer: Self-pay | Admitting: Emergency Medicine

## 2014-03-21 DIAGNOSIS — Z792 Long term (current) use of antibiotics: Secondary | ICD-10-CM | POA: Insufficient documentation

## 2014-03-21 DIAGNOSIS — Z8546 Personal history of malignant neoplasm of prostate: Secondary | ICD-10-CM | POA: Diagnosis not present

## 2014-03-21 DIAGNOSIS — S91301A Unspecified open wound, right foot, initial encounter: Secondary | ICD-10-CM | POA: Diagnosis not present

## 2014-03-21 DIAGNOSIS — Z7982 Long term (current) use of aspirin: Secondary | ICD-10-CM | POA: Insufficient documentation

## 2014-03-21 DIAGNOSIS — M86271 Subacute osteomyelitis, right ankle and foot: Secondary | ICD-10-CM | POA: Diagnosis not present

## 2014-03-21 DIAGNOSIS — M25579 Pain in unspecified ankle and joints of unspecified foot: Secondary | ICD-10-CM | POA: Diagnosis not present

## 2014-03-21 DIAGNOSIS — M868X7 Other osteomyelitis, ankle and foot: Secondary | ICD-10-CM | POA: Diagnosis not present

## 2014-03-21 DIAGNOSIS — M869 Osteomyelitis, unspecified: Secondary | ICD-10-CM | POA: Diagnosis not present

## 2014-03-21 DIAGNOSIS — G629 Polyneuropathy, unspecified: Secondary | ICD-10-CM | POA: Diagnosis not present

## 2014-03-21 DIAGNOSIS — Z4800 Encounter for change or removal of nonsurgical wound dressing: Secondary | ICD-10-CM | POA: Diagnosis present

## 2014-03-21 DIAGNOSIS — Z79899 Other long term (current) drug therapy: Secondary | ICD-10-CM | POA: Diagnosis not present

## 2014-03-21 DIAGNOSIS — L03031 Cellulitis of right toe: Secondary | ICD-10-CM | POA: Diagnosis not present

## 2014-03-21 DIAGNOSIS — L97519 Non-pressure chronic ulcer of other part of right foot with unspecified severity: Secondary | ICD-10-CM | POA: Diagnosis not present

## 2014-03-21 DIAGNOSIS — I10 Essential (primary) hypertension: Secondary | ICD-10-CM | POA: Diagnosis not present

## 2014-03-21 LAB — BASIC METABOLIC PANEL
ANION GAP: 14 (ref 5–15)
BUN: 17 mg/dL (ref 6–23)
CO2: 27 mEq/L (ref 19–32)
Calcium: 10.1 mg/dL (ref 8.4–10.5)
Chloride: 102 mEq/L (ref 96–112)
Creatinine, Ser: 0.92 mg/dL (ref 0.50–1.35)
GFR calc Af Amer: >90 mL/min (ref >90)
GFR, EST NON AFRICAN AMERICAN: 81 mL/min — AB (ref >90)
Glucose, Bld: 102 mg/dL — ABNORMAL HIGH (ref 70–99)
Potassium: 4 mEq/L (ref 3.7–5.3)
Sodium: 143 mEq/L (ref 137–147)

## 2014-03-21 LAB — CBC WITH DIFFERENTIAL/PLATELET
BASOS PCT: 0 % (ref 0–1)
Basophils Absolute: 0 10*3/uL (ref 0.0–0.1)
EOS ABS: 0.2 10*3/uL (ref 0.0–0.7)
Eosinophils Relative: 2 % (ref 0–5)
HCT: 39.7 % (ref 39.0–52.0)
Hemoglobin: 13.5 g/dL (ref 13.0–17.0)
Lymphocytes Relative: 20 % (ref 12–46)
Lymphs Abs: 2.3 10*3/uL (ref 0.7–4.0)
MCH: 29.2 pg (ref 26.0–34.0)
MCHC: 34 g/dL (ref 30.0–36.0)
MCV: 85.7 fL (ref 78.0–100.0)
MONOS PCT: 11 % (ref 3–12)
Monocytes Absolute: 1.2 10*3/uL — ABNORMAL HIGH (ref 0.1–1.0)
Neutro Abs: 7.7 10*3/uL (ref 1.7–7.7)
Neutrophils Relative %: 67 % (ref 43–77)
PLATELETS: 289 10*3/uL (ref 150–400)
RBC: 4.63 MIL/uL (ref 4.22–5.81)
RDW: 13.1 % (ref 11.5–15.5)
WBC: 11.5 10*3/uL — ABNORMAL HIGH (ref 4.0–10.5)

## 2014-03-21 MED ORDER — SODIUM CHLORIDE 0.9 % IV SOLN
Freq: Once | INTRAVENOUS | Status: AC
  Administered 2014-03-21: 13:00:00 via INTRAVENOUS

## 2014-03-21 MED ORDER — PIPERACILLIN-TAZOBACTAM 3.375 G IVPB
3.3750 g | Freq: Once | INTRAVENOUS | Status: AC
  Administered 2014-03-21: 3.375 g via INTRAVENOUS
  Filled 2014-03-21: qty 50

## 2014-03-21 MED ORDER — VANCOMYCIN HCL IN DEXTROSE 1-5 GM/200ML-% IV SOLN: 1000.0000 mg | Freq: Once | INTRAVENOUS | Status: DC

## 2014-03-21 NOTE — ED Notes (Signed)
Pt sent by by Dr. Jerline Pain with a note.  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  Has been on cipro.  Now has increased to cellulitis and swelling of forefood.  Needs admission and ortho consult."

## 2014-03-21 NOTE — Discharge Instructions (Signed)
Bone and Joint Infections °Joint infections are called septic or infectious arthritis. An infected joint may damage cartilage and tissue very quickly. This may destroy the joint. Bone infections (osteomyelitis) may last for years. Joints may become stiff if left untreated. Bacteria are the most common cause. Other causes include viruses and fungi, but these are more rare. Bone and joint infections usually come from injury or infection elsewhere in your body; the germs are carried to your bones or joints through the bloodstream.  °CAUSES  °· Blood-carried germs from an infection elsewhere in your body can eventually spread to a bone or joint. The germ staphylococcus is the most common cause of both osteomyelitis and septic arthritis. °· An injury can introduce germs into your bones or joints. °SYMPTOMS  °· Weight loss. °· Tiredness. °· Chills and fever. °· Bone or joint pain at rest and with activity. °· Tenderness when touching the area or bending the joint. °· Refusal to bear weight on a leg or inability to use an arm due to pain. °· Decreased range of motion in a joint. °· Skin redness, warmth, and tenderness. °· Open skin sores and drainage. °RISK FACTORS °Children, the elderly, and those with weak immune systems are at increased risk of bone and joint infections. It is more common in people with HIV infections and with people on chemotherapy. People are also at increased risk if they have surgery where metal implants are used to stabilize the bone. Plates, screws, or artificial joints provide a surface that bacteria can stick on. Such a growth of bacteria is called biofilm. The biofilm protects bacteria from antibiotics and bodily defenses. This allows germs to multiply. Other reasons for increased risks include:  °· Having previous surgery or injury of a bone or joint. °· Being on high-dose corticosteroids and immunosuppressive medications that weaken your body's resistance to germs. °· Diabetes and  long-standing diseases. °· Use of intravenous street drugs. °· Being on hemodialysis. °· Having a history of urinary tract infections. °· Removal of your spleen (splenectomy). This weakens your immunity. °· Chronic viral infections such as HIV or AIDS. °· Lack of sensation such as paraplegia, quadriplegia, or spina bifida. °DIAGNOSIS  °· Increased numbers of white blood cells in your blood may indicate infection. Some times your caregivers are able to identify the infecting germs by testing your blood. Inflammatory markers present in your bloodstream such as an erythrocyte sedimentation rate (ESR or sed rate) or c-reactive protein (CRP) can be indicators of deep infection. °· Bone scans and X-ray exams are necessary for diagnosing osteomyelitis. They may help your caregiver find the infected areas. Other studies may give more detailed information. They may help detect fluid collections around a joint, abnormal bone surfaces, or be useful in diagnosing septic arthritis. They can find soft tissue swelling and find excess fluid in an infected joint or the adjacent bone. These tests include: °¨ Ultrasound. °¨ CT (computerized tomography). °¨ MRI (magnetic resonance imaging). °· The best test for diagnosing a bone or joint infection is an aspiration or biopsy. Your caregiver will usually use a local anesthetic. He or she can then remove tissue from a bone injury or use a needle to take fluids from an infected joint. A local anesthetic medication numbs the area to be biopsied. Often biopsies are done in the operating room under general anesthesia. This means you will be asleep during the procedure. Tests performed on these samples can identify an infection. °TREATMENT  °· Treatment can help control long-standing   infections, but infections may come back.  Infections can infect any bone or joint at any age.  Bone and joint infections are rarely fatal.  Bone infection left untreated can become a never-ending infection.  It can spread to other areas of your body. It may eventually cause bone death. Reduced limb or joint function can result. In severe cases, this may require removal of a limb. Spinal osteomyelitis is very dangerous. Untreated, it may damage spinal nerves and cause death.  The most common complication of septic arthritis is osteoarthritis with pain and decreased range of motion of the joint. Some forms of treatment may include:  If the infection is caused by bacteria, it is generally treated with antibiotics. You will likely receive the drugs through a vein (intravenously) for anywhere from 2 to 6 weeks. In some cases, especially with children, oral antibiotics following an initial intravenous dose may be effective. The treatment you receive depends on the:  Type of bacteria.  Location of the infection.  Type of surgery that might be done.  Other health conditions or issues you might have.  Your caregiver may drain soft tissue abscesses or pockets of fluid around infected bones or joints. If you have septic arthritis, your caregiver may use a needle to drain pus from the joint on a daily basis. He or she may use an arthroscope to clean the joint or may need to open the joint surgically to remove damaged tissue and infection. An arthroscope is an instrument like a thin lighted telescope. It can be used to look inside the joint.  Surgery is usually needed if the infection has become long-standing. It may also be needed if there is hardware (such as metal plates, screws, or artificial joints) inside the patient. Sometimes a bone or muscle graft is needed to fill in the open space. This promotes growth of new tissues and better blood flow to the area. PREVENTION   Clean and disinfect wounds quickly to help prevent the start of a bone or joint infection. Get treatment for any infections to prevent spread to a bone or joint.  Do not smoke. Smoking decreases healing rates of bone and predisposes to  infection.  When given medications that suppress your immune system, use them according to your caregiver's instructions. Do not take more than prescribed for your condition.  Take good care of your feet and skin, especially if you have diabetes, decreased sensation or circulation problems. SEEK IMMEDIATE MEDICAL CARE IF:   You cannot bear weight on a leg or use an arm, especially following a minor injury. This can be a sign of bone or joint infection.  You think you may have signs or symptoms of a bone or joint infection. Your chance of getting rid of an infection is better if treated early. Document Released: 05/18/2005 Document Revised: 08/10/2011 Document Reviewed: 04/17/2009 [facility] Patient Information 2015 [place], [place]. This information is not intended to replace advice given to you by your health care provider. Make sure you discuss any questions you have with your health care provider.

## 2014-03-21 NOTE — ED Provider Notes (Signed)
CSN: [id number]     Arrival date & time 03/21/14  1046 History   First MD Initiated Contact with Patient 03/21/14 1124     Chief Complaint  Patient presents with  . Wound Check     (Consider location/radiation/quality/duration/timing/severity/associated sxs/prior Treatment) Patient is a 73 y.o. male presenting with wound check. The history is provided by the patient. No language interpreter was used.  Wound Check Pertinent negatives include no chest pain, chills, fever, myalgias, nausea, vomiting or weakness. Associated symptoms comments: He has been treated for right foot ulceration by Dr. Jerline Pain at the [facility] for the past several months. The foot has recently become swollen and red prompting MRI performed on 03/16/14 and showing osteomyelitis. He was sent here by Dr. Jerline Pain for admission. .    Past Medical History  Diagnosis Date  . Hypertension   . Cancer   . Prostate cancer     2002   Past Surgical History  Procedure Laterality Date  . Prostate surgery    . Colonoscopy    . Polypectomy    . Cyst on head      back of head/ 2011   Family History  Problem Relation Age of Onset  . Colon cancer Neg Hx    History  Substance Use Topics  . Smoking status: Never Smoker   . Smokeless tobacco: Never Used  . Alcohol Use: No    Review of Systems  Constitutional: Negative for fever and chills.  Respiratory: Negative.  Negative for shortness of breath.   Cardiovascular: Positive for leg swelling. Negative for chest pain.  Gastrointestinal: Negative.  Negative for nausea and vomiting.  Musculoskeletal: Negative.  Negative for myalgias.  Skin: Positive for color change and wound.  Allergic/Immunologic: Negative for immunocompromised state.  Neurological: Negative.  Negative for weakness.  Hematological: Does not bruise/bleed easily.      Allergies  Review of patient's allergies indicates no known allergies.  Home Medications   Prior to Admission medications    Medication Sig Start Date End Date Taking? Authorizing Provider  Ascorbic Acid (VITAMIN C) 1000 MG tablet Take 1,000 mg by mouth daily.    Yes Historical Provider, MD  aspirin 81 MG tablet Take 81 mg by mouth daily.   Yes Historical Provider, MD  calcium-vitamin D (OSCAL WITH D) 500-200 MG-UNIT per tablet Take 1 tablet by mouth daily.   Yes Historical Provider, MD  ciprofloxacin (CIPRO) 500 MG tablet Take 500 mg by mouth 2 (two) times daily.   Yes Historical Provider, MD  fish oil-omega-3 fatty acids 1000 MG capsule Take 1 g by mouth daily.    Yes Historical Provider, MD  Influenza Vac Split Quad (FLUZONE) 0.25 ML injection Inject 0.25 mLs into the muscle once.   Yes Historical Provider, MD  lisinopril-hydrochlorothiazide (PRINZIDE,ZESTORETIC) 20-25 MG per tablet Take 1 tablet by mouth every morning.    Yes Historical Provider, MD  metoprolol succinate (TOPROL-XL) 50 MG 24 hr tablet Take 50 mg by mouth daily. Take with or immediately following a meal.   Yes Historical Provider, MD  Multiple Vitamin (MULTIVITAMIN) tablet Take 1 tablet by mouth daily. Senior MVI-Take one daily   Yes Historical Provider, MD  vitamin E 400 UNIT capsule Take 400 Units by mouth daily.   Yes Historical Provider, MD   BP 147/68  Pulse 71  Temp(Src) 97.8 F (36.6 C) (Oral)  Resp 18  SpO2 100% Physical Exam  Constitutional: He is oriented to person, place, and time. He appears well-developed  and well-nourished.  Eyes: Conjunctivae are normal.  Neck: Normal range of motion.  Cardiovascular: Normal rate and intact distal pulses.   Pulmonary/Chest: Effort normal.  Abdominal: Soft. There is no tenderness.  Musculoskeletal: Normal range of motion. He exhibits edema.  Ulceration right 1st MTP joint with marked swelling and redness of this joint, the great toe and extending to forefoot. The LE is swollen to calf without redness. No drainage.   Neurological: He is alert and oriented to person, place, and time.  Skin:  Skin is warm and dry.  Psychiatric: He has a normal mood and affect.    ED Course  Procedures (including critical care time) Labs Review Labs Reviewed  CBC WITH DIFFERENTIAL - Abnormal; Notable for the following:    WBC 11.5 (*)    Monocytes Absolute 1.2 (*)    All other components within normal limits  BASIC METABOLIC PANEL   Results for orders placed during the hospital encounter of 03/21/14  CBC WITH DIFFERENTIAL      Result Value Ref Range   WBC 11.5 (*) 4.0 - 10.5 K/uL   RBC 4.63  4.22 - 5.81 MIL/uL   Hemoglobin 13.5  13.0 - 17.0 g/dL   HCT 39.7  39.0 - 52.0 %   MCV 85.7  78.0 - 100.0 fL   MCH 29.2  26.0 - 34.0 pg   MCHC 34.0  30.0 - 36.0 g/dL   RDW 13.1  11.5 - 15.5 %   Platelets 289  150 - 400 K/uL   Neutrophils Relative % 67  43 - 77 %   Neutro Abs 7.7  1.7 - 7.7 K/uL   Lymphocytes Relative 20  12 - 46 %   Lymphs Abs 2.3  0.7 - 4.0 K/uL   Monocytes Relative 11  3 - 12 %   Monocytes Absolute 1.2 (*) 0.1 - 1.0 K/uL   Eosinophils Relative 2  0 - 5 %   Eosinophils Absolute 0.2  0.0 - 0.7 K/uL   Basophils Relative 0  0 - 1 %   Basophils Absolute 0.0  0.0 - 0.1 K/uL  BASIC METABOLIC PANEL      Result Value Ref Range   Sodium 143  137 - 147 mEq/L   Potassium 4.0  3.7 - 5.3 mEq/L   Chloride 102  96 - 112 mEq/L   CO2 27  19 - 32 mEq/L   Glucose, Bld 102 (*) 70 - 99 mg/dL   BUN 17  6 - 23 mg/dL   Creatinine, Ser 0.92  0.50 - 1.35 mg/dL   Calcium 10.1  8.4 - 10.5 mg/dL   GFR calc non Af Amer 81 (*) >90 mL/min   GFR calc Af Amer >90  >90 mL/min   Anion gap 14  5 - 15    Imaging Review No results found.   EKG Interpretation None      MDM   Final diagnoses:  None    1. Right foot osteomyelitis  The patient is very well appearing, afebrile, without significant leukocytosis and without complaint of pain.   Discussed with Dr. Ronnie Derby (ortho) who advised he does not work on feet and who consulted Dr. Meridee Score. Dr. Sharol Given will see the patient in the office  this afternoon to arrange for elective surgical intervention. Recommends continuation of Cipro outpatient. Dr. Jerline Pain of the [facility] updated on plan of care. Patient is comfortable with discharge and immediate outpatient follow up. He continues to endorse no pain in the  foot. Return precautions discussed with the patient.     Dewaine Oats, PA-C 03/21/14 1335

## 2014-03-21 NOTE — ED Notes (Signed)
Wet to dry/ bulky wrap placed on pt right foot, per Barbera Setters, EDNP.

## 2014-03-22 ENCOUNTER — Other Ambulatory Visit (HOSPITAL_COMMUNITY): Payer: Self-pay | Admitting: Orthopedic Surgery

## 2014-03-22 ENCOUNTER — Encounter (HOSPITAL_COMMUNITY): Payer: Self-pay | Admitting: *Deleted

## 2014-03-22 ENCOUNTER — Encounter (HOSPITAL_COMMUNITY): Payer: Self-pay | Admitting: Pharmacy Technician

## 2014-03-22 MED ORDER — CEFAZOLIN SODIUM-DEXTROSE 2-3 GM-% IV SOLR
2.0000 g | INTRAVENOUS | Status: AC
  Administered 2014-03-23: 2 g via INTRAVENOUS
  Filled 2014-03-22: qty 50

## 2014-03-23 ENCOUNTER — Encounter (HOSPITAL_COMMUNITY): Payer: Medicare Other | Admitting: Anesthesiology

## 2014-03-23 ENCOUNTER — Encounter (HOSPITAL_COMMUNITY)
Admission: RE | Disposition: A | Discharge status: Home / Self Care | Payer: Self-pay | Source: Ambulatory Visit | Attending: Orthopedic Surgery

## 2014-03-23 ENCOUNTER — Ambulatory Visit (HOSPITAL_COMMUNITY)
Admission: RE | Admit: 2014-03-23 | Discharge: 2014-03-24 | Disposition: A | Discharge status: Home / Self Care | Payer: Medicare Other | Source: Ambulatory Visit | Attending: Orthopedic Surgery | Admitting: Orthopedic Surgery

## 2014-03-23 ENCOUNTER — Ambulatory Visit (HOSPITAL_COMMUNITY): Payer: Medicare Other | Admitting: Anesthesiology

## 2014-03-23 ENCOUNTER — Encounter (HOSPITAL_COMMUNITY): Payer: Self-pay | Admitting: *Deleted

## 2014-03-23 DIAGNOSIS — M869 Osteomyelitis, unspecified: Secondary | ICD-10-CM | POA: Diagnosis not present

## 2014-03-23 DIAGNOSIS — Z7982 Long term (current) use of aspirin: Secondary | ICD-10-CM | POA: Diagnosis not present

## 2014-03-23 DIAGNOSIS — I10 Essential (primary) hypertension: Secondary | ICD-10-CM | POA: Insufficient documentation

## 2014-03-23 DIAGNOSIS — L97519 Non-pressure chronic ulcer of other part of right foot with unspecified severity: Secondary | ICD-10-CM | POA: Diagnosis not present

## 2014-03-23 DIAGNOSIS — M86271 Subacute osteomyelitis, right ankle and foot: Secondary | ICD-10-CM | POA: Diagnosis not present

## 2014-03-23 DIAGNOSIS — R262 Difficulty in walking, not elsewhere classified: Secondary | ICD-10-CM | POA: Insufficient documentation

## 2014-03-23 DIAGNOSIS — R52 Pain, unspecified: Secondary | ICD-10-CM | POA: Insufficient documentation

## 2014-03-23 DIAGNOSIS — M86179 Other acute osteomyelitis, unspecified ankle and foot: Secondary | ICD-10-CM | POA: Diagnosis present

## 2014-03-23 DIAGNOSIS — M86171 Other acute osteomyelitis, right ankle and foot: Secondary | ICD-10-CM | POA: Diagnosis present

## 2014-03-23 DIAGNOSIS — Z8546 Personal history of malignant neoplasm of prostate: Secondary | ICD-10-CM | POA: Diagnosis not present

## 2014-03-23 DIAGNOSIS — E780 Pure hypercholesterolemia: Secondary | ICD-10-CM | POA: Insufficient documentation

## 2014-03-23 HISTORY — DX: Unspecified cataract: H26.9

## 2014-03-23 HISTORY — PX: AMPUTATION: SHX166

## 2014-03-23 LAB — COMPREHENSIVE METABOLIC PANEL
ALT: 13 U/L (ref 0–53)
AST: 16 U/L (ref 0–37)
Albumin: 3.2 g/dL — ABNORMAL LOW (ref 3.5–5.2)
Alkaline Phosphatase: 63 U/L (ref 39–117)
Anion gap: 13 (ref 5–15)
BUN: 20 mg/dL (ref 6–23)
CO2: 25 mEq/L (ref 19–32)
Calcium: 9.5 mg/dL (ref 8.4–10.5)
Chloride: 101 mEq/L (ref 96–112)
Creatinine, Ser: 0.96 mg/dL (ref 0.50–1.35)
GFR calc Af Amer: >90 mL/min (ref >90)
GFR calc non Af Amer: 80 mL/min — ABNORMAL LOW (ref >90)
Glucose, Bld: 112 mg/dL — ABNORMAL HIGH (ref 70–99)
Potassium: 3.7 mEq/L (ref 3.7–5.3)
SODIUM: 139 meq/L (ref 137–147)
Total Bilirubin: 0.4 mg/dL (ref 0.3–1.2)
Total Protein: 7.3 g/dL (ref 6.0–8.3)

## 2014-03-23 LAB — CBC
HCT: 39.1 % (ref 39.0–52.0)
Hemoglobin: 13.4 g/dL (ref 13.0–17.0)
MCH: 29.1 pg (ref 26.0–34.0)
MCHC: 34.3 g/dL (ref 30.0–36.0)
MCV: 85 fL (ref 78.0–100.0)
PLATELETS: 314 10*3/uL (ref 150–400)
RBC: 4.6 MIL/uL (ref 4.22–5.81)
RDW: 13.1 % (ref 11.5–15.5)
WBC: 9.7 10*3/uL (ref 4.0–10.5)

## 2014-03-23 LAB — APTT: aPTT: 33 seconds (ref 24–37)

## 2014-03-23 LAB — SURGICAL PCR SCREEN
MRSA, PCR: NEGATIVE
Staphylococcus aureus: NEGATIVE

## 2014-03-23 LAB — PROTIME-INR
INR: 1.08 (ref 0.00–1.49)
PROTHROMBIN TIME: 14.1 s (ref 11.6–15.2)

## 2014-03-23 SURGERY — AMPUTATION, FOOT, RAY
Anesthesia: General | Site: Foot | Laterality: Right

## 2014-03-23 MED ORDER — METOPROLOL SUCCINATE ER 50 MG PO TB24
50.0000 mg | ORAL_TABLET | Freq: Every day | ORAL | Status: DC
  Administered 2014-03-24: 50 mg via ORAL
  Filled 2014-03-23: qty 1

## 2014-03-23 MED ORDER — 0.9 % SODIUM CHLORIDE (POUR BTL) OPTIME
TOPICAL | Status: DC | PRN
  Administered 2014-03-23: 1000 mL

## 2014-03-23 MED ORDER — METOCLOPRAMIDE HCL 5 MG/ML IJ SOLN
5.0000 mg | Freq: Three times a day (TID) | INTRAMUSCULAR | Status: DC | PRN
  Filled 2014-03-23: qty 2

## 2014-03-23 MED ORDER — MUPIROCIN 2 % EX OINT
1.0000 "application " | TOPICAL_OINTMENT | Freq: Once | CUTANEOUS | Status: AC
  Administered 2014-03-23: 1 via TOPICAL

## 2014-03-23 MED ORDER — OXYCODONE HCL 5 MG PO TABS: 5.0000 mg | ORAL_TABLET | Freq: Once | ORAL | Status: DC | PRN

## 2014-03-23 MED ORDER — ASPIRIN 81 MG PO TABS: 81.0000 mg | ORAL_TABLET | Freq: Every day | ORAL | Status: DC

## 2014-03-23 MED ORDER — FENTANYL CITRATE 0.05 MG/ML IJ SOLN
INTRAMUSCULAR | Status: DC | PRN
  Administered 2014-03-23: 50 ug via INTRAVENOUS

## 2014-03-23 MED ORDER — HYDROMORPHONE HCL 1 MG/ML IJ SOLN: 0.5000 mg | INTRAMUSCULAR | Status: DC | PRN

## 2014-03-23 MED ORDER — SODIUM CHLORIDE 0.9 % IV SOLN: INTRAVENOUS | Status: DC

## 2014-03-23 MED ORDER — METHOCARBAMOL 500 MG PO TABS
500.0000 mg | ORAL_TABLET | Freq: Four times a day (QID) | ORAL | Status: DC | PRN
  Administered 2014-03-24: 500 mg via ORAL
  Filled 2014-03-23 (×2): qty 1

## 2014-03-23 MED ORDER — MUPIROCIN 2 % EX OINT
TOPICAL_OINTMENT | CUTANEOUS | Status: AC
  Administered 2014-03-23: 1 via TOPICAL
  Filled 2014-03-23: qty 22

## 2014-03-23 MED ORDER — LISINOPRIL-HYDROCHLOROTHIAZIDE 20-25 MG PO TABS: 1.0000 | ORAL_TABLET | Freq: Every morning | ORAL | Status: DC

## 2014-03-23 MED ORDER — ONDANSETRON HCL 4 MG/2ML IJ SOLN
INTRAMUSCULAR | Status: DC | PRN
  Administered 2014-03-23: 4 mg via INTRAVENOUS

## 2014-03-23 MED ORDER — HYDROMORPHONE HCL 1 MG/ML IJ SOLN: 0.2500 mg | INTRAMUSCULAR | Status: DC | PRN

## 2014-03-23 MED ORDER — LISINOPRIL 20 MG PO TABS
20.0000 mg | ORAL_TABLET | Freq: Every day | ORAL | Status: DC
  Administered 2014-03-23: 20 mg via ORAL
  Filled 2014-03-23 (×2): qty 1

## 2014-03-23 MED ORDER — HYDROCHLOROTHIAZIDE 25 MG PO TABS
25.0000 mg | ORAL_TABLET | Freq: Every day | ORAL | Status: DC
  Administered 2014-03-23 – 2014-03-24 (×2): 25 mg via ORAL
  Filled 2014-03-23 (×2): qty 1

## 2014-03-23 MED ORDER — DOCUSATE SODIUM 100 MG PO CAPS
100.0000 mg | ORAL_CAPSULE | Freq: Two times a day (BID) | ORAL | Status: DC
  Administered 2014-03-24 (×2): 100 mg via ORAL
  Filled 2014-03-23 (×4): qty 1

## 2014-03-23 MED ORDER — LACTATED RINGERS IV SOLN
INTRAVENOUS | Status: DC | PRN
  Administered 2014-03-23: 13:00:00 via INTRAVENOUS

## 2014-03-23 MED ORDER — METOCLOPRAMIDE HCL 5 MG PO TABS
5.0000 mg | ORAL_TABLET | Freq: Three times a day (TID) | ORAL | Status: DC | PRN
  Filled 2014-03-23: qty 2

## 2014-03-23 MED ORDER — LIDOCAINE HCL (CARDIAC) 20 MG/ML IV SOLN
INTRAVENOUS | Status: AC
  Filled 2014-03-23: qty 5

## 2014-03-23 MED ORDER — PROPOFOL 10 MG/ML IV BOLUS
INTRAVENOUS | Status: DC | PRN
  Administered 2014-03-23: 70 mg via INTRAVENOUS
  Administered 2014-03-23: 130 mg via INTRAVENOUS

## 2014-03-23 MED ORDER — OXYCODONE HCL 5 MG/5ML PO SOLN: 5.0000 mg | Freq: Once | ORAL | Status: DC | PRN

## 2014-03-23 MED ORDER — LIDOCAINE HCL (CARDIAC) 20 MG/ML IV SOLN
INTRAVENOUS | Status: DC | PRN
  Administered 2014-03-23: 60 mg via INTRAVENOUS

## 2014-03-23 MED ORDER — ACETAMINOPHEN 325 MG PO TABS: 325.0000 mg | ORAL_TABLET | ORAL | Status: DC | PRN

## 2014-03-23 MED ORDER — ONDANSETRON HCL 4 MG PO TABS
4.0000 mg | ORAL_TABLET | Freq: Four times a day (QID) | ORAL | Status: DC | PRN
  Administered 2014-03-24: 4 mg via ORAL
  Filled 2014-03-23 (×2): qty 1

## 2014-03-23 MED ORDER — ONDANSETRON HCL 4 MG/2ML IJ SOLN
4.0000 mg | Freq: Four times a day (QID) | INTRAMUSCULAR | Status: DC | PRN
  Filled 2014-03-23: qty 2

## 2014-03-23 MED ORDER — MIDAZOLAM HCL 2 MG/2ML IJ SOLN
INTRAMUSCULAR | Status: AC
  Filled 2014-03-23: qty 2

## 2014-03-23 MED ORDER — METHOCARBAMOL 1000 MG/10ML IJ SOLN
500.0000 mg | Freq: Four times a day (QID) | INTRAMUSCULAR | Status: DC | PRN
  Filled 2014-03-23: qty 5

## 2014-03-23 MED ORDER — ASPIRIN EC 81 MG PO TBEC
81.0000 mg | DELAYED_RELEASE_TABLET | Freq: Every day | ORAL | Status: DC
  Administered 2014-03-23 – 2014-03-24 (×2): 81 mg via ORAL
  Filled 2014-03-23 (×2): qty 1

## 2014-03-23 MED ORDER — PROPOFOL 10 MG/ML IV BOLUS
INTRAVENOUS | Status: AC
Start: 2014-03-23 — End: 2014-03-23
  Filled 2014-03-23: qty 20

## 2014-03-23 MED ORDER — CEFAZOLIN SODIUM 1-5 GM-% IV SOLN
1.0000 g | Freq: Four times a day (QID) | INTRAVENOUS | Status: AC
  Administered 2014-03-23 – 2014-03-24 (×3): 1 g via INTRAVENOUS
  Filled 2014-03-23 (×3): qty 50

## 2014-03-23 MED ORDER — OXYCODONE-ACETAMINOPHEN 5-325 MG PO TABS
1.0000 | ORAL_TABLET | ORAL | Status: DC | PRN
  Administered 2014-03-24: 2 via ORAL
  Filled 2014-03-23: qty 2

## 2014-03-23 MED ORDER — FENTANYL CITRATE 0.05 MG/ML IJ SOLN
INTRAMUSCULAR | Status: AC
  Filled 2014-03-23: qty 5

## 2014-03-23 MED ORDER — ACETAMINOPHEN 160 MG/5ML PO SOLN
325.0000 mg | ORAL | Status: DC | PRN
  Filled 2014-03-23: qty 20.3

## 2014-03-23 MED ORDER — LACTATED RINGERS IV SOLN
INTRAVENOUS | Status: DC
  Administered 2014-03-23: 12:00:00 via INTRAVENOUS

## 2014-03-23 SURGICAL SUPPLY — 31 items
BANDAGE ELASTIC 4 VELCRO ST LF (GAUZE/BANDAGES/DRESSINGS) ×3 IMPLANT
BLADE SAW SGTL MED 73X18.5 STR (BLADE) ×3 IMPLANT
BNDG COHESIVE 4X5 TAN STRL (GAUZE/BANDAGES/DRESSINGS) ×3 IMPLANT
BNDG GAUZE ELAST 4 BULKY (GAUZE/BANDAGES/DRESSINGS) ×6 IMPLANT
COVER SURGICAL LIGHT HANDLE (MISCELLANEOUS) ×3 IMPLANT
DRAPE U-SHAPE 47X51 STRL (DRAPES) ×6 IMPLANT
DRSG ADAPTIC 3X8 NADH LF (GAUZE/BANDAGES/DRESSINGS) ×3 IMPLANT
DRSG PAD ABDOMINAL 8X10 ST (GAUZE/BANDAGES/DRESSINGS) ×6 IMPLANT
DURAPREP 26ML APPLICATOR (WOUND CARE) ×3 IMPLANT
ELECT REM PT RETURN 9FT ADLT (ELECTROSURGICAL) ×3
ELECTRODE REM PT RTRN 9FT ADLT (ELECTROSURGICAL) ×1 IMPLANT
GAUZE SPONGE 4X4 12PLY STRL (GAUZE/BANDAGES/DRESSINGS) ×3 IMPLANT
GAUZE VASELINE 3X9 (GAUZE/BANDAGES/DRESSINGS) ×3 IMPLANT
GLOVE BIOGEL PI IND STRL 9 (GLOVE) ×1 IMPLANT
GLOVE BIOGEL PI INDICATOR 9 (GLOVE) ×2
GLOVE SURG ORTHO 9.0 STRL STRW (GLOVE) ×3 IMPLANT
GOWN STRL REUS W/ TWL XL LVL3 (GOWN DISPOSABLE) ×2 IMPLANT
GOWN STRL REUS W/TWL XL LVL3 (GOWN DISPOSABLE) ×6
KIT BASIN OR (CUSTOM PROCEDURE TRAY) ×3 IMPLANT
KIT ROOM TURNOVER OR (KITS) ×3 IMPLANT
NS IRRIG 1000ML POUR BTL (IV SOLUTION) ×3 IMPLANT
PACK ORTHO EXTREMITY (CUSTOM PROCEDURE TRAY) ×3 IMPLANT
PAD ARMBOARD 7.5X6 YLW CONV (MISCELLANEOUS) ×6 IMPLANT
SPONGE GAUZE 4X4 12PLY STER LF (GAUZE/BANDAGES/DRESSINGS) ×3 IMPLANT
SPONGE LAP 18X18 X RAY DECT (DISPOSABLE) ×3 IMPLANT
STOCKINETTE IMPERVIOUS LG (DRAPES) IMPLANT
SUT ETHILON 2 0 PSLX (SUTURE) ×6 IMPLANT
TOWEL OR 17X24 6PK STRL BLUE (TOWEL DISPOSABLE) ×3 IMPLANT
TOWEL OR 17X26 10 PK STRL BLUE (TOWEL DISPOSABLE) ×3 IMPLANT
UNDERPAD 30X30 INCONTINENT (UNDERPADS AND DIAPERS) ×3 IMPLANT
WATER STERILE IRR 1000ML POUR (IV SOLUTION) ×3 IMPLANT

## 2014-03-23 NOTE — Progress Notes (Signed)
Orthopedic Tech Progress Note Patient Details:  Theodore Dominguez 1940/06/29 [id number]  Ortho Devices Type of Ortho Device: Postop shoe/boot Ortho Device/Splint Location: rle Ortho Device/Splint Interventions: Application   Theodore Dominguez 03/23/2014, 2:46 PM

## 2014-03-23 NOTE — Op Note (Signed)
03/23/2014  5:16 PM  PATIENT:  Theodore Dominguez    PRE-OPERATIVE DIAGNOSIS:  Osteomyelitis and Ulcer Right Great Toe MTP  POST-OPERATIVE DIAGNOSIS:  Same  PROCEDURE:  1st Ray Amputation Right Foot  SURGEON:  Newt Minion, MD  PHYSICIAN ASSISTANT:None ANESTHESIA:   General  PREOPERATIVE INDICATIONS:  Theodore Dominguez is a  73 y.o. male with a diagnosis of Osteomyelitis and Ulcer Right Great Toe MTP who failed conservative measures and elected for surgical management.    The risks benefits and alternatives were discussed with the patient preoperatively including but not limited to the risks of infection, bleeding, nerve injury, cardiopulmonary complications, the need for revision surgery, among others, and the patient was willing to proceed.  OPERATIVE IMPLANTS: None  OPERATIVE FINDINGS: Large abscess MTP joint right great toe  OPERATIVE PROCEDURE: Patient was brought to the operating room and underwent a general anesthetic. After adequate levels and anesthesia obtained patient's right lower extremity was prepped using DuraPrep draped into a sterile field. A racquet incision was made around the first ray to amputate the toe ulcerative tissue in one block of tissue. The metatarsal was resected from the base of the metatarsal. Electrocautery was used for hemostasis. The wound was irrigated with normal saline. There is no purulence no signs of deep infection extending to the surgical margins. The incision was closed using 2-0 nylon. A sterile compressive dressing was applied. Patient was extubated taken to the PACU in stable condition.

## 2014-03-23 NOTE — Anesthesia Procedure Notes (Signed)
Procedure Name: LMA Insertion Date/Time: 03/23/2014 1:07 PM Performed by: Luciana Axe K Pre-anesthesia Checklist: Patient identified, Emergency Drugs available, Suction available, Patient being monitored and Timeout performed Patient Re-evaluated:Patient Re-evaluated prior to inductionOxygen Delivery Method: Circle system utilized Preoxygenation: Pre-oxygenation with 100% oxygen Intubation Type: IV induction Ventilation: Mask ventilation without difficulty LMA: LMA inserted LMA Size: 5.0 Number of attempts: 1 Placement Confirmation: positive ETCO2,  CO2 detector and breath sounds checked- equal and bilateral Tube secured with: Tape Dental Injury: Teeth and Oropharynx as per pre-operative assessment

## 2014-03-23 NOTE — Anesthesia Preprocedure Evaluation (Addendum)
Anesthesia Evaluation  Patient identified by MRN, date of birth, ID band Patient awake    Reviewed: Allergy & Precautions, H&P , NPO status , Patient's Chart, lab work & pertinent test results, reviewed documented beta blocker date and time   History of Anesthesia Complications Negative for: history of anesthetic complications  Airway Mallampati: II TM Distance: >3 FB Neck ROM: Full    Dental  (+) Teeth Intact, Dental Advisory Given   Pulmonary neg pulmonary ROS,  breath sounds clear to auscultation        Cardiovascular hypertension, Pt. on medications and Pt. on home beta blockers Rhythm:Regular     Neuro/Psych negative neurological ROS  negative psych ROS   GI/Hepatic negative GI ROS, Neg liver ROS,   Endo/Other  negative endocrine ROS  Renal/GU negative Renal ROS     Musculoskeletal Right first toe osteomyelitis    Abdominal   Peds  Hematology negative hematology ROS (+)   Anesthesia Other Findings   Reproductive/Obstetrics                          Anesthesia Physical Anesthesia Plan  ASA: II  Anesthesia Plan: General   Post-op Pain Management:    Induction: Intravenous  Airway Management Planned: LMA  Additional Equipment: None  Intra-op Plan:   Post-operative Plan: Extubation in OR  Informed Consent: I have reviewed the patients History and Physical, chart, labs and discussed the procedure including the risks, benefits and alternatives for the proposed anesthesia with the patient or authorized representative who has indicated his/her understanding and acceptance.   Dental advisory given  Plan Discussed with: CRNA and Surgeon  Anesthesia Plan Comments:         Anesthesia Quick Evaluation

## 2014-03-23 NOTE — Anesthesia Postprocedure Evaluation (Signed)
  Anesthesia Post-op Note  Patient: Theodore Dominguez  Procedure(s) Performed: Procedure(s): 1st Ray Amputation Right Foot (Right)  Patient Location: PACU  Anesthesia Type:General  Level of Consciousness: awake  Airway and Oxygen Therapy: Patient Spontanous Breathing and Patient connected to nasal cannula oxygen  Post-op Pain: none  Post-op Assessment: Post-op Vital signs reviewed, Patient's Cardiovascular Status Stable, Respiratory Function Stable, Patent Airway, No signs of Nausea or vomiting and Pain level controlled  Post-op Vital Signs: Reviewed and stable  Last Vitals:  Filed Vitals:   03/23/14 1515  BP: 137/65  Pulse: 65  Temp:   Resp: 12    Complications: No apparent anesthesia complications

## 2014-03-23 NOTE — Transfer of Care (Signed)
Immediate Anesthesia Transfer of Care Note  Patient: Theodore Dominguez  Procedure(s) Performed: Procedure(s): 1st Ray Amputation Right Foot (Right)  Patient Location: PACU  Anesthesia Type:General  Level of Consciousness: awake and alert   Airway & Oxygen Therapy: Patient Spontanous Breathing and Patient connected to nasal cannula oxygen  Post-op Assessment: Report given to PACU RN and Post -op Vital signs reviewed and stable  Post vital signs: Reviewed and stable  Complications: No apparent anesthesia complications

## 2014-03-23 NOTE — H&P (Signed)
Theodore Dominguez is an 73 y.o. male.   Chief Complaint: Osteomyelitis ulceration right great toe HPI: Patient is a 73 year old gentleman with osteomyelitis ulceration right great toe which has failed conservative wound care.  Past Medical History  Diagnosis Date  . Hypertension   . Cancer   . Prostate cancer     2002  . Cataract of both eyes     immature    Past Surgical History  Procedure Laterality Date  . Prostate surgery    . Colonoscopy    . Polypectomy    . Cyst on head      back of head/ 2011    Family History  Problem Relation Age of Onset  . Colon cancer Neg Hx    Social History:  reports that he has never smoked. He has never used smokeless tobacco. He reports that he drinks alcohol. He reports that he does not use illicit drugs.  Allergies: No Known Allergies  No prescriptions prior to admission    Results for orders placed during the hospital encounter of 03/21/14 (from the past 48 hour(s))  CBC WITH DIFFERENTIAL     Status: Abnormal   Collection Time    03/21/14 11:45 AM      Result Value Ref Range   WBC 11.5 (*) 4.0 - 10.5 K/uL   RBC 4.63  4.22 - 5.81 MIL/uL   Hemoglobin 13.5  13.0 - 17.0 g/dL   HCT 39.7  39.0 - 52.0 %   MCV 85.7  78.0 - 100.0 fL   MCH 29.2  26.0 - 34.0 pg   MCHC 34.0  30.0 - 36.0 g/dL   RDW 13.1  11.5 - 15.5 %   Platelets 289  150 - 400 K/uL   Neutrophils Relative % 67  43 - 77 %   Neutro Abs 7.7  1.7 - 7.7 K/uL   Lymphocytes Relative 20  12 - 46 %   Lymphs Abs 2.3  0.7 - 4.0 K/uL   Monocytes Relative 11  3 - 12 %   Monocytes Absolute 1.2 (*) 0.1 - 1.0 K/uL   Eosinophils Relative 2  0 - 5 %   Eosinophils Absolute 0.2  0.0 - 0.7 K/uL   Basophils Relative 0  0 - 1 %   Basophils Absolute 0.0  0.0 - 0.1 K/uL  BASIC METABOLIC PANEL     Status: Abnormal   Collection Time    03/21/14 11:45 AM      Result Value Ref Range   Sodium 143  137 - 147 mEq/L   Potassium 4.0  3.7 - 5.3 mEq/L   Chloride 102  96 - 112 mEq/L   CO2 27  19 - 32  mEq/L   Glucose, Bld 102 (*) 70 - 99 mg/dL   BUN 17  6 - 23 mg/dL   Creatinine, Ser 0.92  0.50 - 1.35 mg/dL   Calcium 10.1  8.4 - 10.5 mg/dL   GFR calc non Af Amer 81 (*) >90 mL/min   GFR calc Af Amer >90  >90 mL/min   Comment: (NOTE)     The eGFR has been calculated using the CKD EPI equation.     This calculation has not been validated in all clinical situations.     eGFR's persistently <90 mL/min signify possible Chronic Kidney     Disease.   Anion gap 14  5 - 15   No results found.  ROS  There were no vitals taken for this visit. Physical  Exam  On examination patient has osteomyelitis ulceration right great toe Assessment/Plan Assessment: Osteomyelitis ulceration right great toe.  Plan: Will plan for right foot first ray amputation. Risks and benefits were discussed including need for additional surgery. Patient states he understands and wishes to proceed at this time.  DUDA,MARCUS V 03/23/2014, 6:35 AM

## 2014-03-24 DIAGNOSIS — M86179 Other acute osteomyelitis, unspecified ankle and foot: Secondary | ICD-10-CM | POA: Diagnosis not present

## 2014-03-24 MED ORDER — ASPIRIN EC 325 MG PO TBEC
325.0000 mg | DELAYED_RELEASE_TABLET | Freq: Every day | ORAL | Status: DC
Start: 2014-03-24 — End: 2023-08-19

## 2014-03-24 MED ORDER — HYDROCODONE-ACETAMINOPHEN 5-325 MG PO TABS: 1.0000 | ORAL_TABLET | Freq: Four times a day (QID) | ORAL | Status: DC | PRN

## 2014-03-24 NOTE — Evaluation (Signed)
Physical Therapy Evaluation Patient Details Name: Theodore Dominguez MRN: [id number] DOB: 12-27-1940 Today's Date: 03/24/2014   History of Present Illness  Pt is a 73 y.o. 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   Clinical Impression  Patient evaluated by Physical Therapy with no further acute PT needs identified. All education has been completed and the patient has no further questions.  See below for any follow-up Physial Therapy or equipment needs. PT is signing off. Thank you for this referral.     Follow Up Recommendations No PT follow up;Supervision/Assistance - 24 hour    Equipment Recommendations  Rolling walker with 5" wheels;Crutches    Recommendations for Other Services       Precautions / Restrictions Precautions Precautions: None Restrictions Weight Bearing Restrictions: Yes RLE Weight Bearing: Touchdown weight bearing Other Position/Activity Restrictions: with post op shoe      Mobility  Bed Mobility Overal bed mobility: Modified Independent                Transfers Overall transfer level: Needs assistance Equipment used: Rolling walker (2 wheeled);Crutches Transfers: Sit to/from Stand Sit to Stand: Supervision         General transfer comment: supervision for cues for technique and safety with both RW and crutches; pt more steady with use of RW and able to maintain TDWB status   Ambulation/Gait Ambulation/Gait assistance: Supervision Ambulation Distance (Feet): 50 Feet (20', 30') Assistive device: Rolling walker (2 wheeled);Crutches Gait Pattern/deviations: Step-to pattern (TDWB status on Rt LE ) Gait velocity: decr Gait velocity interpretation: Below normal speed for age/gender General Gait Details: cues for technique; pt more stable/steady with use of RW; good ability to maintain TDWB status on Rt LE  Stairs Stairs: Yes Stairs assistance: Min guard Stair Management: No rails;Step to pattern;Backwards;With walker Number of Stairs: 2 General  stair comments: demo and verbalized instruction for technique; pt given handout for carryover  Wheelchair Mobility    Modified Rankin (Stroke Patients Only)       Balance Overall balance assessment: Needs assistance Sitting-balance support: Feet supported;No upper extremity supported Sitting balance-Leahy Scale: Good     Standing balance support: During functional activity;Bilateral upper extremity supported Standing balance-Leahy Scale: Poor Standing balance comment: relies on UE support to balance and maintain TDWB status                              Pertinent Vitals/Pain Pain Assessment: No/denies pain    Home Living Family/patient expects to be discharged to:: Private residence Living Arrangements: Children Available Help at Discharge: Family;Available PRN/intermittently Type of Home: House Home Access: Stairs to enter Entrance Stairs-Rails: None Entrance Stairs-Number of Steps: 2 Home Layout: Two level;Bed/bath upstairs Home Equipment: None Additional Comments: pt has tub shower    Prior Function Level of Independence: Independent               Hand Dominance        Extremity/Trunk Assessment   Upper Extremity Assessment: Overall WFL for tasks assessed           Lower Extremity Assessment: Overall WFL for tasks assessed      Cervical / Trunk Assessment: Normal  Communication   Communication: No difficulties  Cognition Arousal/Alertness: Awake/alert Behavior During Therapy: WFL for tasks assessed/performed Overall Cognitive Status: Within Functional Limits for tasks assessed                      General Comments  General comments (skin integrity, edema, etc.): educated on importance of elevation of Rt LE    Exercises        Assessment/Plan    PT Assessment Patent does not need any further PT services  PT Diagnosis Difficulty walking;Generalized weakness;Acute pain   PT Problem List    PT Treatment Interventions      PT Goals (Current goals can be found in the Care Plan section) Acute Rehab PT Goals Patient Stated Goal: home today PT Goal Formulation: All assessment and education complete, DC therapy    Frequency     Barriers to discharge        Co-evaluation               End of Session Equipment Utilized During Treatment: Gait belt Activity Tolerance: Patient tolerated treatment well Patient left: in chair;with call bell/phone within reach Nurse Communication: Mobility status;Weight bearing status    Functional Assessment Tool Used: clinical judgement Functional Limitation: Mobility: Walking and moving around Mobility: Walking and Moving Around Current Status ([id number]): At least 1 percent but less than 20 percent impaired, limited or restricted Mobility: Walking and Moving Around Goal Status [phone]): At least 1 percent but less than 20 percent impaired, limited or restricted Mobility: Walking and Moving Around Discharge Status [phone]): At least 1 percent but less than 20 percent impaired, limited or restricted    Time: 0914-0938 PT Time Calculation (min): 24 min   Charges:   PT Evaluation $Initial PT Evaluation Tier I: 1 Procedure PT Treatments $Gait Training: 8-22 mins   PT G Codes:   Functional Assessment Tool Used: clinical judgement Functional Limitation: Mobility: Walking and moving around    [place], [place], [place]  [phone] 03/24/2014, 10:02 AM

## 2014-03-24 NOTE — Progress Notes (Signed)
Discharge instructions reviewed and patient verbalizes understanding. Patient has equipment and is ready for discharge. Son will provide transportation home.

## 2014-03-24 NOTE — Discharge Summary (Signed)
Physician Discharge Summary  Patient ID: Theodore Dominguez MRN: [id number] DOB/AGE: 1941-01-12 73 y.o.  Admit date: 03/23/2014 Discharge date: 03/24/2014  Admission Diagnoses: Osteomyelitis abscess right great toe  Discharge Diagnoses:  Active Problems:   Osteomyelitis of foot, acute   Discharged Condition: stable  Hospital Course: Patient's hospital course was essentially unremarkable. He underwent amputation of the first ray. Postoperatively he progressed well and was discharged to home.  Consults: None  Significant Diagnostic Studies: labs: Routine labs  Treatments: surgery: See operative note  Discharge Exam: Blood pressure 97/53, pulse 67, temperature 98.3 F (36.8 C), temperature source Oral, resp. rate 16, height 5' 9.5" (1.765 m), weight 81.647 kg (180 lb), SpO2 97.00%. Incision/Wound: dressing clean dry and intact  Disposition: 01-Home or Self Care     Medication List    ASK your doctor about these medications       aspirin 81 MG tablet  Take 81 mg by mouth daily.     calcium-vitamin D 500-200 MG-UNIT per tablet  Commonly known as:  OSCAL WITH D  Take 1 tablet by mouth daily.     doxycycline 100 MG capsule  Commonly known as:  VIBRAMYCIN  Take 100 mg by mouth 2 (two) times daily. For 15 days     fish oil-omega-3 fatty acids 1000 MG capsule  Take 1 g by mouth daily.     lisinopril-hydrochlorothiazide 20-25 MG per tablet  Commonly known as:  PRINZIDE,ZESTORETIC  Take 1 tablet by mouth every morning.     metoprolol succinate 50 MG 24 hr tablet  Commonly known as:  TOPROL-XL  Take 50 mg by mouth daily. Take with or immediately following a meal.     multivitamin tablet  Take 1 tablet by mouth daily. Senior MVI-Take one daily     vitamin C 1000 MG tablet  Take 1,000 mg by mouth daily.     vitamin E 400 UNIT capsule  Take 400 Units by mouth daily.           Follow-up Information   Follow up with Jsean Taussig V, MD In 1 week.   Specialty:   Orthopedic Surgery   Contact information:   [place] [address] [phone]       Signed: Newt Minion 03/24/2014, 6:22 AM

## 2014-03-24 NOTE — Progress Notes (Signed)
Orthopedic Tech Progress Note Patient Details:  Theodore Dominguez 12/31/1940 [id number] Crutches delivered to room Ortho Devices Type of Ortho Device: Crutches Ortho Device/Splint Location: rle Ortho Device/Splint Interventions: Ordered   Asia Mellody Memos 03/24/2014, 12:48 PM

## 2014-03-24 NOTE — Care Management Note (Signed)
CARE MANAGEMENT NOTE 03/24/2014  Patient:  Theodore Dominguez, Theodore Dominguez   Account Number:  [id number]  Date Initiated:  03/24/2014  Documentation initiated by:  Ricki Miller  Subjective/Objective Assessment:   73 yr old male admitted with osteoarthritis and ulcer of right great toe. patient had amputaion of 1st ray right foot.     Action/Plan:   Patient has no home health needs, has support at discharge.   Anticipated DC Date:  03/24/2014   Anticipated DC Plan:  [place]  CM consult      PAC Choice  DURABLE MEDICAL EQUIPMENT   Choice offered to / List presented to:     DME arranged  Vassie Moselle      DME agency  [place] arranged  NA      Status of service:  Completed, signed off Medicare Important Message given?   (If response is "NO", the following Medicare IM given date fields will be blank) Date Medicare IM given:   Medicare IM given by:   Date Additional Medicare IM given:   Additional Medicare IM given by:    Discharge Disposition:  HOME/SELF CARE  Per UR Regulation:    If discussed at Long Length of Stay Meetings, dates discussed:    Comments:

## 2014-03-24 NOTE — Discharge Instructions (Signed)
Touchdown weightbearing right heel. Keep dressing clean dry and intact. Elevate right foot

## 2014-03-26 NOTE — ED Provider Notes (Signed)
Medical screening examination/treatment/procedure(s) were conducted as a shared visit with non-physician practitioner(s) and myself.  I personally evaluated the patient during the encounter.   EKG Interpretation None     73 year old male with right foot osteomyelitis. Chronic wound, but acutely worse. He is not toxic. Prompt surgical evaluation was arranged with Dr. Sharol Given in the office in 2 days. I feel that this is a reasonable plan and that he is safe for discharge at this time to follow-up then.   Virgel Manifold, MD 03/26/14 1447

## 2014-03-27 ENCOUNTER — Encounter (HOSPITAL_COMMUNITY): Payer: Self-pay | Admitting: Orthopedic Surgery

## 2014-04-09 ENCOUNTER — Ambulatory Visit: Payer: Medicare Other | Admitting: Internal Medicine

## 2014-04-10 ENCOUNTER — Encounter (HOSPITAL_COMMUNITY): Payer: Self-pay | Admitting: Anesthesiology

## 2014-07-17 DIAGNOSIS — C61 Malignant neoplasm of prostate: Secondary | ICD-10-CM | POA: Diagnosis not present

## 2014-07-17 DIAGNOSIS — Z8546 Personal history of malignant neoplasm of prostate: Secondary | ICD-10-CM | POA: Diagnosis not present

## 2014-07-24 DIAGNOSIS — N5201 Erectile dysfunction due to arterial insufficiency: Secondary | ICD-10-CM | POA: Diagnosis not present

## 2014-07-24 DIAGNOSIS — C61 Malignant neoplasm of prostate: Secondary | ICD-10-CM | POA: Diagnosis not present

## 2014-08-15 DIAGNOSIS — C61 Malignant neoplasm of prostate: Secondary | ICD-10-CM | POA: Diagnosis not present

## 2014-08-15 DIAGNOSIS — I10 Essential (primary) hypertension: Secondary | ICD-10-CM | POA: Diagnosis not present

## 2014-08-15 DIAGNOSIS — Z0000 Encounter for general adult medical examination without abnormal findings: Secondary | ICD-10-CM | POA: Diagnosis not present

## 2014-08-15 DIAGNOSIS — R7309 Other abnormal glucose: Secondary | ICD-10-CM | POA: Diagnosis not present

## 2014-08-15 DIAGNOSIS — E780 Pure hypercholesterolemia: Secondary | ICD-10-CM | POA: Diagnosis not present

## 2014-10-04 ENCOUNTER — Emergency Department (HOSPITAL_COMMUNITY)
Admission: EM | Admit: 2014-10-04 | Discharge: 2014-10-04 | Disposition: A | Discharge status: Home / Self Care | Payer: Medicare Other | Attending: Emergency Medicine | Admitting: Emergency Medicine

## 2014-10-04 ENCOUNTER — Encounter (HOSPITAL_COMMUNITY): Payer: Self-pay | Admitting: Emergency Medicine

## 2014-10-04 DIAGNOSIS — M79671 Pain in right foot: Secondary | ICD-10-CM | POA: Diagnosis not present

## 2014-10-04 DIAGNOSIS — M25571 Pain in right ankle and joints of right foot: Secondary | ICD-10-CM | POA: Diagnosis present

## 2014-10-04 DIAGNOSIS — Z79899 Other long term (current) drug therapy: Secondary | ICD-10-CM | POA: Diagnosis not present

## 2014-10-04 DIAGNOSIS — Z792 Long term (current) use of antibiotics: Secondary | ICD-10-CM | POA: Insufficient documentation

## 2014-10-04 DIAGNOSIS — Z7982 Long term (current) use of aspirin: Secondary | ICD-10-CM | POA: Diagnosis not present

## 2014-10-04 DIAGNOSIS — L84 Corns and callosities: Secondary | ICD-10-CM | POA: Diagnosis not present

## 2014-10-04 DIAGNOSIS — R21 Rash and other nonspecific skin eruption: Secondary | ICD-10-CM | POA: Insufficient documentation

## 2014-10-04 DIAGNOSIS — Z8546 Personal history of malignant neoplasm of prostate: Secondary | ICD-10-CM | POA: Diagnosis not present

## 2014-10-04 DIAGNOSIS — Z859 Personal history of malignant neoplasm, unspecified: Secondary | ICD-10-CM | POA: Diagnosis not present

## 2014-10-04 DIAGNOSIS — I10 Essential (primary) hypertension: Secondary | ICD-10-CM | POA: Insufficient documentation

## 2014-10-04 DIAGNOSIS — H269 Unspecified cataract: Secondary | ICD-10-CM | POA: Insufficient documentation

## 2014-10-04 NOTE — ED Notes (Signed)
Per pt, states he has callas on right foot that has been bleeding

## 2014-10-04 NOTE — Discharge Instructions (Signed)
Return if pus drainage occurs, increased redness, swelling occurs.   Corns and Calluses Corns are small areas of thickened skin that usually occur on the top, sides, or tip of a toe. They contain a cone-shaped core with a point that can press on a nerve below. This causes pain. Calluses are areas of thickened skin that usually develop on hands, fingers, palms, soles of the feet, and heels. These are areas that experience frequent friction or pressure. CAUSES  Corns are usually the result of rubbing (friction) or pressure from shoes that are too tight or do not fit properly. Calluses are caused by repeated friction and pressure on the affected areas. SYMPTOMS  A hard growth on the skin.  Pain or tenderness under the skin.  Sometimes, redness and swelling.  Increased discomfort while wearing tight-fitting shoes. DIAGNOSIS  Your caregiver can usually tell what the problem is by doing a physical exam. TREATMENT  Removing the cause of the friction or pressure is usually the only treatment needed. However, sometimes medicines can be used to help soften the hardened, thickened areas. These medicines include salicylic acid plasters and 12% ammonium lactate lotion. These medicines should only be used under the direction of your caregiver. HOME CARE INSTRUCTIONS   Try to remove pressure from the affected area.  You may wear donut-shaped corn pads to protect your skin.  You may use a pumice stone or nonmetallic nail file to gently reduce the thickness of a corn.  Wear properly fitted footwear.  If you have calluses on the hands, wear gloves during activities that cause friction.  If you have diabetes, you should regularly examine your feet. Tell your caregiver if you notice any problems with your feet. SEEK IMMEDIATE MEDICAL CARE IF:   You have increased pain, swelling, redness, or warmth in the affected area.  Your corn or callus starts to drain fluid or bleeds.  You are not getting  better, even with treatment. Document Released: 02/22/2004 Document Revised: 08/10/2011 Document Reviewed: 01/13/2011 [facility] Patient Information 2015 [place], [place]. This information is not intended to replace advice given to you by your health care provider. Make sure you discuss any questions you have with your health care provider.

## 2014-10-04 NOTE — ED Provider Notes (Signed)
CSN: [id number]     Arrival date & time 10/04/14  0906 History   First MD Initiated Contact with Patient 10/04/14 [phone]     Chief Complaint  Patient presents with  . Foot Pain     (Consider location/radiation/quality/duration/timing/severity/associated sxs/prior Treatment) HPI Comments: Pt comes in with cc of foot pain. Reports needing surgery to the R foot in the past, Dr. Sharol Given following, from a non healing ulcer. Reports that he started having bleeding from the callus on Thursday, a week ago, after golfing. Since then, he has noticed continues mild bleeding, and he just wants to be sure that the area is not infected. He started taking cipro (has 10-15 leftovers) yday. He denies n/v/f/c, purulent drainage, increased pain or swelling.  Patient is a 74 y.o. male presenting with lower extremity pain. The history is provided by the patient.  Foot Pain    Past Medical History  Diagnosis Date  . Hypertension   . Cancer   . Prostate cancer     2002  . Cataract of both eyes     immature   Past Surgical History  Procedure Laterality Date  . Prostate surgery    . Colonoscopy    . Polypectomy    . Cyst on head      back of head/ 2011  . Amputation Right 03/23/2014    Procedure: 1st Ray Amputation Right Foot;  Surgeon: Newt Minion, MD;  Location: [facility];  Service: Orthopedics;  Laterality: Right;   Family History  Problem Relation Age of Onset  . Colon cancer Neg Hx    History  Substance Use Topics  . Smoking status: Never Smoker   . Smokeless tobacco: Never Used  . Alcohol Use: Yes     Comment: " a glass of wine avery once in awhile'    Review of Systems  Constitutional: Negative for fever, chills and activity change.  Musculoskeletal: Negative for gait problem.  Skin: Positive for wound. Negative for rash.  Allergic/Immunologic: Negative for immunocompromised state.  Hematological: Does not bruise/bleed easily.      Allergies  Review of patient's allergies indicates  no known allergies.  Home Medications   Prior to Admission medications   Medication Sig Start Date End Date Taking? Authorizing Provider  Ascorbic Acid (VITAMIN C) 1000 MG tablet Take 1,000 mg by mouth daily.     Historical Provider, MD  aspirin 81 MG tablet Take 81 mg by mouth daily.    Historical Provider, MD  aspirin EC 325 MG tablet Take 1 tablet (325 mg total) by mouth daily. 03/24/14   Newt Minion, MD  calcium-vitamin D (OSCAL WITH D) 500-200 MG-UNIT per tablet Take 1 tablet by mouth daily.    Historical Provider, MD  doxycycline (VIBRAMYCIN) 100 MG capsule Take 100 mg by mouth 2 (two) times daily. For 15 days 03/21/14   Historical Provider, MD  fish oil-omega-3 fatty acids 1000 MG capsule Take 1 g by mouth daily.     Historical Provider, MD  HYDROcodone-acetaminophen (NORCO) 5-325 MG per tablet Take 1 tablet by mouth every 6 (six) hours as needed. 03/24/14   Newt Minion, MD  lisinopril-hydrochlorothiazide (PRINZIDE,ZESTORETIC) 20-25 MG per tablet Take 1 tablet by mouth every morning.     Historical Provider, MD  metoprolol succinate (TOPROL-XL) 50 MG 24 hr tablet Take 50 mg by mouth daily. Take with or immediately following a meal.    Historical Provider, MD  Multiple Vitamin (MULTIVITAMIN) tablet Take 1 tablet by mouth  daily. Senior MVI-Take one daily    Historical Provider, MD  vitamin E 400 UNIT capsule Take 400 Units by mouth daily.    Historical Provider, MD   BP 134/59 mmHg  Pulse 65  Temp(Src) 97.7 F (36.5 C) (Oral)  Resp 18  Ht 5\' 10"  (1.778 m)  Wt 183 lb (83.008 kg)  BMI 26.26 kg/m2  SpO2 96% Physical Exam  Constitutional: He is oriented to person, place, and time. He appears well-developed.  Musculoskeletal:  R foot - plantar aspect, by the MTP great toe, pt has a callus, with an ulcer in the middle, no rash surrounding it, non tender, no drainage and no foul smell  Neurological: He is alert and oriented to person, place, and time.  Skin: Skin is warm. Rash  noted.  Nursing note and vitals reviewed.   ED Course  Procedures (including critical care time) Labs Review Labs Reviewed - No data to display  Imaging Review No results found.   EKG Interpretation None      MDM   Final diagnoses:  Corn or callus    Pt comes in with cc of corn. No abscess, no eryhthema, no tenderness, no signs of infection. Will ask him to just finish cipro course. Pt sees Dr. Sharol Given later this month.    Varney Biles, MD 10/04/14 1016

## 2014-10-15 DIAGNOSIS — L97511 Non-pressure chronic ulcer of other part of right foot limited to breakdown of skin: Secondary | ICD-10-CM | POA: Diagnosis not present

## 2014-11-12 ENCOUNTER — Encounter: Payer: Self-pay | Admitting: Gastroenterology

## 2014-11-19 DIAGNOSIS — R221 Localized swelling, mass and lump, neck: Secondary | ICD-10-CM | POA: Diagnosis not present

## 2014-11-29 DIAGNOSIS — L97511 Non-pressure chronic ulcer of other part of right foot limited to breakdown of skin: Secondary | ICD-10-CM | POA: Diagnosis not present

## 2014-12-20 ENCOUNTER — Other Ambulatory Visit (HOSPITAL_COMMUNITY): Payer: Self-pay | Admitting: Orthopedic Surgery

## 2014-12-20 ENCOUNTER — Other Ambulatory Visit (HOSPITAL_COMMUNITY): Payer: Self-pay | Admitting: *Deleted

## 2014-12-20 ENCOUNTER — Encounter (HOSPITAL_COMMUNITY): Payer: Self-pay | Admitting: *Deleted

## 2014-12-20 DIAGNOSIS — M86171 Other acute osteomyelitis, right ankle and foot: Secondary | ICD-10-CM | POA: Diagnosis not present

## 2014-12-20 DIAGNOSIS — L97511 Non-pressure chronic ulcer of other part of right foot limited to breakdown of skin: Secondary | ICD-10-CM | POA: Diagnosis not present

## 2014-12-20 DIAGNOSIS — E1142 Type 2 diabetes mellitus with diabetic polyneuropathy: Secondary | ICD-10-CM | POA: Diagnosis not present

## 2014-12-21 ENCOUNTER — Encounter (HOSPITAL_COMMUNITY): Payer: Self-pay | Admitting: *Deleted

## 2014-12-21 ENCOUNTER — Ambulatory Visit (HOSPITAL_COMMUNITY)
Admission: RE | Admit: 2014-12-21 | Discharge: 2014-12-22 | Disposition: A | Discharge status: Home Health | Payer: Medicare Other | Source: Ambulatory Visit | Attending: Orthopedic Surgery | Admitting: Orthopedic Surgery

## 2014-12-21 ENCOUNTER — Ambulatory Visit (HOSPITAL_COMMUNITY): Payer: Medicare Other | Admitting: Certified Registered Nurse Anesthetist

## 2014-12-21 ENCOUNTER — Encounter (HOSPITAL_COMMUNITY)
Admission: RE | Disposition: A | Discharge status: Home / Self Care | Payer: Self-pay | Source: Ambulatory Visit | Attending: Orthopedic Surgery

## 2014-12-21 DIAGNOSIS — M869 Osteomyelitis, unspecified: Secondary | ICD-10-CM | POA: Diagnosis not present

## 2014-12-21 DIAGNOSIS — M868X7 Other osteomyelitis, ankle and foot: Secondary | ICD-10-CM | POA: Diagnosis not present

## 2014-12-21 DIAGNOSIS — L03115 Cellulitis of right lower limb: Secondary | ICD-10-CM | POA: Insufficient documentation

## 2014-12-21 DIAGNOSIS — Z8546 Personal history of malignant neoplasm of prostate: Secondary | ICD-10-CM | POA: Diagnosis not present

## 2014-12-21 DIAGNOSIS — C61 Malignant neoplasm of prostate: Secondary | ICD-10-CM | POA: Diagnosis not present

## 2014-12-21 DIAGNOSIS — S98319A Complete traumatic amputation of unspecified midfoot, initial encounter: Secondary | ICD-10-CM | POA: Diagnosis present

## 2014-12-21 DIAGNOSIS — R262 Difficulty in walking, not elsewhere classified: Secondary | ICD-10-CM | POA: Diagnosis not present

## 2014-12-21 DIAGNOSIS — L02611 Cutaneous abscess of right foot: Secondary | ICD-10-CM | POA: Diagnosis not present

## 2014-12-21 DIAGNOSIS — I10 Essential (primary) hypertension: Secondary | ICD-10-CM | POA: Insufficient documentation

## 2014-12-21 DIAGNOSIS — L97511 Non-pressure chronic ulcer of other part of right foot limited to breakdown of skin: Secondary | ICD-10-CM | POA: Diagnosis not present

## 2014-12-21 HISTORY — PX: AMPUTATION: SHX166

## 2014-12-21 LAB — COMPREHENSIVE METABOLIC PANEL
ALBUMIN: 3.4 g/dL — AB (ref 3.5–5.0)
ALT: 16 U/L — ABNORMAL LOW (ref 17–63)
ANION GAP: 7 (ref 5–15)
AST: 20 U/L (ref 15–41)
Alkaline Phosphatase: 63 U/L (ref 38–126)
BILIRUBIN TOTAL: 0.9 mg/dL (ref 0.3–1.2)
BUN: 16 mg/dL (ref 6–20)
CO2: 25 mmol/L (ref 22–32)
CREATININE: 1.02 mg/dL (ref 0.61–1.24)
Calcium: 9.1 mg/dL (ref 8.9–10.3)
Chloride: 107 mmol/L (ref 101–111)
GFR calc Af Amer: >60 mL/min (ref >60)
GLUCOSE: 110 mg/dL — AB (ref 65–99)
POTASSIUM: 3.7 mmol/L (ref 3.5–5.1)
SODIUM: 139 mmol/L (ref 135–145)
Total Protein: 6.9 g/dL (ref 6.5–8.1)

## 2014-12-21 LAB — APTT: APTT: 29 s (ref 24–37)

## 2014-12-21 LAB — CBC
HCT: 39.9 % (ref 39.0–52.0)
Hemoglobin: 13.6 g/dL (ref 13.0–17.0)
MCH: 29.8 pg (ref 26.0–34.0)
MCHC: 34.1 g/dL (ref 30.0–36.0)
MCV: 87.5 fL (ref 78.0–100.0)
Platelets: 257 10*3/uL (ref 150–400)
RBC: 4.56 MIL/uL (ref 4.22–5.81)
RDW: 13.2 % (ref 11.5–15.5)
WBC: 13.8 10*3/uL — AB (ref 4.0–10.5)

## 2014-12-21 LAB — PROTIME-INR
INR: 1.19 (ref 0.00–1.49)
PROTHROMBIN TIME: 15.3 s — AB (ref 11.6–15.2)

## 2014-12-21 SURGERY — AMPUTATION, FOOT, PARTIAL
Anesthesia: General | Laterality: Right

## 2014-12-21 MED ORDER — CEFAZOLIN SODIUM 1-5 GM-% IV SOLN
1.0000 g | Freq: Four times a day (QID) | INTRAVENOUS | Status: AC
  Administered 2014-12-21 – 2014-12-22 (×3): 1 g via INTRAVENOUS
  Filled 2014-12-21 (×3): qty 50

## 2014-12-21 MED ORDER — HYDROMORPHONE HCL 1 MG/ML IJ SOLN: 1.0000 mg | INTRAMUSCULAR | Status: DC | PRN

## 2014-12-21 MED ORDER — METHOCARBAMOL 1000 MG/10ML IJ SOLN
500.0000 mg | Freq: Four times a day (QID) | INTRAVENOUS | Status: DC | PRN
  Filled 2014-12-21: qty 5

## 2014-12-21 MED ORDER — FENTANYL CITRATE (PF) 250 MCG/5ML IJ SOLN
INTRAMUSCULAR | Status: AC
  Filled 2014-12-21: qty 5

## 2014-12-21 MED ORDER — CEFAZOLIN SODIUM-DEXTROSE 2-3 GM-% IV SOLR
2.0000 g | INTRAVENOUS | Status: AC
  Administered 2014-12-21: 2 g via INTRAVENOUS
  Filled 2014-12-21: qty 50

## 2014-12-21 MED ORDER — HYDROMORPHONE HCL 1 MG/ML IJ SOLN: 0.2500 mg | INTRAMUSCULAR | Status: DC | PRN

## 2014-12-21 MED ORDER — METHOCARBAMOL 500 MG PO TABS: 500.0000 mg | ORAL_TABLET | Freq: Four times a day (QID) | ORAL | Status: DC | PRN

## 2014-12-21 MED ORDER — MIDAZOLAM HCL 2 MG/2ML IJ SOLN
INTRAMUSCULAR | Status: AC
  Filled 2014-12-21: qty 2

## 2014-12-21 MED ORDER — LIDOCAINE HCL (CARDIAC) 20 MG/ML IV SOLN
INTRAVENOUS | Status: DC | PRN
  Administered 2014-12-21: 100 mg via INTRAVENOUS

## 2014-12-21 MED ORDER — LISINOPRIL 20 MG PO TABS
20.0000 mg | ORAL_TABLET | Freq: Every day | ORAL | Status: DC
  Administered 2014-12-21 – 2014-12-22 (×2): 20 mg via ORAL
  Filled 2014-12-21 (×2): qty 1

## 2014-12-21 MED ORDER — MEPERIDINE HCL 25 MG/ML IJ SOLN: 6.2500 mg | INTRAMUSCULAR | Status: DC | PRN

## 2014-12-21 MED ORDER — SODIUM CHLORIDE 0.9 % IV SOLN
INTRAVENOUS | Status: DC
  Administered 2014-12-21: 20:00:00 via INTRAVENOUS

## 2014-12-21 MED ORDER — PHENYLEPHRINE HCL 10 MG/ML IJ SOLN
INTRAMUSCULAR | Status: DC | PRN
  Administered 2014-12-21: 120 ug via INTRAVENOUS

## 2014-12-21 MED ORDER — METOCLOPRAMIDE HCL 5 MG/ML IJ SOLN: 5.0000 mg | Freq: Three times a day (TID) | INTRAMUSCULAR | Status: DC | PRN

## 2014-12-21 MED ORDER — ONDANSETRON HCL 4 MG PO TABS: 4.0000 mg | ORAL_TABLET | Freq: Four times a day (QID) | ORAL | Status: DC | PRN

## 2014-12-21 MED ORDER — ACETAMINOPHEN 650 MG RE SUPP: 650.0000 mg | Freq: Four times a day (QID) | RECTAL | Status: DC | PRN

## 2014-12-21 MED ORDER — ACETAMINOPHEN 325 MG PO TABS: 650.0000 mg | ORAL_TABLET | Freq: Four times a day (QID) | ORAL | Status: DC | PRN

## 2014-12-21 MED ORDER — EPHEDRINE SULFATE 50 MG/ML IJ SOLN
INTRAMUSCULAR | Status: DC | PRN
  Administered 2014-12-21: 10 mg via INTRAVENOUS

## 2014-12-21 MED ORDER — ASPIRIN 81 MG PO TABS: 81.0000 mg | ORAL_TABLET | Freq: Every day | ORAL | Status: DC

## 2014-12-21 MED ORDER — PROPOFOL 10 MG/ML IV BOLUS
INTRAVENOUS | Status: DC | PRN
  Administered 2014-12-21: 160 mg via INTRAVENOUS

## 2014-12-21 MED ORDER — HYDROCHLOROTHIAZIDE 25 MG PO TABS
25.0000 mg | ORAL_TABLET | Freq: Every day | ORAL | Status: DC
  Administered 2014-12-21 – 2014-12-22 (×2): 25 mg via ORAL
  Filled 2014-12-21 (×2): qty 1

## 2014-12-21 MED ORDER — CHLORHEXIDINE GLUCONATE 4 % EX LIQD: 60.0000 mL | CUTANEOUS | Status: DC

## 2014-12-21 MED ORDER — FENTANYL CITRATE (PF) 100 MCG/2ML IJ SOLN
INTRAMUSCULAR | Status: DC | PRN
  Administered 2014-12-21: 100 ug via INTRAVENOUS

## 2014-12-21 MED ORDER — OXYCODONE HCL 5 MG PO TABS: 5.0000 mg | ORAL_TABLET | ORAL | Status: DC | PRN

## 2014-12-21 MED ORDER — LISINOPRIL-HYDROCHLOROTHIAZIDE 20-25 MG PO TABS: 1.0000 | ORAL_TABLET | Freq: Every morning | ORAL | Status: DC

## 2014-12-21 MED ORDER — ONDANSETRON HCL 4 MG/2ML IJ SOLN
INTRAMUSCULAR | Status: DC | PRN
  Administered 2014-12-21: 4 mg via INTRAVENOUS

## 2014-12-21 MED ORDER — 0.9 % SODIUM CHLORIDE (POUR BTL) OPTIME
TOPICAL | Status: DC | PRN
  Administered 2014-12-21: 1000 mL

## 2014-12-21 MED ORDER — LACTATED RINGERS IV SOLN
INTRAVENOUS | Status: DC
  Administered 2014-12-21 (×3): via INTRAVENOUS

## 2014-12-21 MED ORDER — METOCLOPRAMIDE HCL 5 MG PO TABS: 5.0000 mg | ORAL_TABLET | Freq: Three times a day (TID) | ORAL | Status: DC | PRN

## 2014-12-21 MED ORDER — ONDANSETRON HCL 4 MG/2ML IJ SOLN: 4.0000 mg | Freq: Four times a day (QID) | INTRAMUSCULAR | Status: DC | PRN

## 2014-12-21 MED ORDER — ONDANSETRON HCL 4 MG/2ML IJ SOLN: 4.0000 mg | Freq: Once | INTRAMUSCULAR | Status: DC | PRN

## 2014-12-21 MED ORDER — MIDAZOLAM HCL 5 MG/5ML IJ SOLN
INTRAMUSCULAR | Status: DC | PRN
  Administered 2014-12-21: 2 mg via INTRAVENOUS

## 2014-12-21 MED ORDER — PROPOFOL 10 MG/ML IV BOLUS
INTRAVENOUS | Status: AC
  Filled 2014-12-21: qty 20

## 2014-12-21 MED ORDER — ASPIRIN EC 81 MG PO TBEC
81.0000 mg | DELAYED_RELEASE_TABLET | Freq: Every day | ORAL | Status: DC
  Administered 2014-12-21 – 2014-12-22 (×2): 81 mg via ORAL
  Filled 2014-12-21 (×2): qty 1

## 2014-12-21 MED ORDER — METOPROLOL SUCCINATE ER 50 MG PO TB24
50.0000 mg | ORAL_TABLET | Freq: Every day | ORAL | Status: DC
  Administered 2014-12-22: 50 mg via ORAL
  Filled 2014-12-21: qty 1

## 2014-12-21 SURGICAL SUPPLY — 28 items
BLADE SAW SGTL HD 18.5X60.5X1. (BLADE) ×3 IMPLANT
BLADE SURG 10 STRL SS (BLADE) IMPLANT
BNDG COHESIVE 4X5 TAN STRL (GAUZE/BANDAGES/DRESSINGS) ×3 IMPLANT
BNDG GAUZE ELAST 4 BULKY (GAUZE/BANDAGES/DRESSINGS) ×6 IMPLANT
COVER SURGICAL LIGHT HANDLE (MISCELLANEOUS) ×6 IMPLANT
DRAPE U-SHAPE 47X51 STRL (DRAPES) ×3 IMPLANT
DRSG ADAPTIC 3X8 NADH LF (GAUZE/BANDAGES/DRESSINGS) ×3 IMPLANT
DRSG PAD ABDOMINAL 8X10 ST (GAUZE/BANDAGES/DRESSINGS) ×3 IMPLANT
DURAPREP 26ML APPLICATOR (WOUND CARE) ×3 IMPLANT
ELECT REM PT RETURN 9FT ADLT (ELECTROSURGICAL) ×3
ELECTRODE REM PT RTRN 9FT ADLT (ELECTROSURGICAL) ×1 IMPLANT
GAUZE SPONGE 4X4 12PLY STRL (GAUZE/BANDAGES/DRESSINGS) ×3 IMPLANT
GLOVE BIOGEL PI IND STRL 9 (GLOVE) ×1 IMPLANT
GLOVE BIOGEL PI INDICATOR 9 (GLOVE) ×2
GLOVE SURG ORTHO 9.0 STRL STRW (GLOVE) ×3 IMPLANT
GOWN STRL REUS W/ TWL XL LVL3 (GOWN DISPOSABLE) ×3 IMPLANT
GOWN STRL REUS W/TWL XL LVL3 (GOWN DISPOSABLE) ×9
KIT BASIN OR (CUSTOM PROCEDURE TRAY) ×3 IMPLANT
KIT ROOM TURNOVER OR (KITS) ×3 IMPLANT
NS IRRIG 1000ML POUR BTL (IV SOLUTION) ×3 IMPLANT
PACK ORTHO EXTREMITY (CUSTOM PROCEDURE TRAY) ×3 IMPLANT
PAD ARMBOARD 7.5X6 YLW CONV (MISCELLANEOUS) ×6 IMPLANT
SPONGE LAP 18X18 X RAY DECT (DISPOSABLE) ×3 IMPLANT
SUT ETHILON 2 0 PSLX (SUTURE) ×6 IMPLANT
SUT VIC AB 2-0 CTB1 (SUTURE) IMPLANT
TOWEL OR 17X24 6PK STRL BLUE (TOWEL DISPOSABLE) ×3 IMPLANT
TOWEL OR 17X26 10 PK STRL BLUE (TOWEL DISPOSABLE) ×3 IMPLANT
WATER STERILE IRR 1000ML POUR (IV SOLUTION) IMPLANT

## 2014-12-21 NOTE — Transfer of Care (Signed)
Immediate Anesthesia Transfer of Care Note  Patient: Theodore Dominguez  Procedure(s) Performed: Procedure(s): Right Transmetatarsal Amputation (Right)  Patient Location: PACU  Anesthesia Type:General  Level of Consciousness: awake, alert , oriented and patient cooperative  Airway & Oxygen Therapy: Patient Spontanous Breathing and Patient connected to nasal cannula oxygen  Post-op Assessment: Report given to RN, Post -op Vital signs reviewed and stable, Patient moving all extremities and Patient moving all extremities X 4  Post vital signs: Reviewed and stable  Last Vitals:  Filed Vitals:   12/21/14 1806  BP:   Pulse:   Temp: 36.6 C  Resp:     Complications: No apparent anesthesia complications

## 2014-12-21 NOTE — Progress Notes (Signed)
Orthopedic Tech Progress Note Patient Details:  Theodore Dominguez [birth year] [id number] Fit pt. for post-op shoe.  Left shoe at bedside for use in AM. Ortho Devices Type of Ortho Device: Postop shoe/boot Ortho Device/Splint Location: RLE Ortho Device/Splint Interventions: Other (comment)   Orson, Rho 12/21/2014, 8:32 PM

## 2014-12-21 NOTE — Anesthesia Postprocedure Evaluation (Signed)
Anesthesia Post Note  Patient: Theodore Dominguez  Procedure(s) Performed: Procedure(s) (LRB): Right Transmetatarsal Amputation (Right)  Anesthesia type: general  Patient location: PACU  Post pain: Pain level controlled  Post assessment: Patient's Cardiovascular Status Stable  Last Vitals:  Filed Vitals:   12/21/14 1830  BP: 136/64  Pulse: 74  Temp: 36.6 C  Resp: 13    Post vital signs: Reviewed and stable  Level of consciousness: sedated  Complications: No apparent anesthesia complications

## 2014-12-21 NOTE — Op Note (Signed)
12/21/2014  5:53 PM  PATIENT:  Theodore Dominguez    PRE-OPERATIVE DIAGNOSIS:  Osteomyelitis Right 2nd Metatarsal  POST-OPERATIVE DIAGNOSIS:  Same  PROCEDURE:  Right Transmetatarsal Amputation  SURGEON:  Newt Minion, MD  PHYSICIAN ASSISTANT:None ANESTHESIA:   General  PREOPERATIVE INDICATIONS:  Theodore Dominguez is a  74 y.o. male with a diagnosis of Osteomyelitis Right 2nd Metatarsal who failed conservative measures and elected for surgical management.    The risks benefits and alternatives were discussed with the patient preoperatively including but not limited to the risks of infection, bleeding, nerve injury, cardiopulmonary complications, the need for revision surgery, among others, and the patient was willing to proceed.  OPERATIVE IMPLANTS: None  OPERATIVE FINDINGS: Calcified vessels  OPERATIVE PROCEDURE: Patient was brought to the operating room and underwent a general and aesthetic. After adequate levels of anesthesia were obtained patient's right lower extremity was prepped using DuraPrep draped into a sterile field. A timeout was called. A fishmouth incision was made this was carried down to bone the bone was cut with an oscillating saw beveled plantarly. The electrocautery was used for hemostasis. The wound was irrigated with normal saline. The incision was closed using 2-0 nylon. A sterile compressive dressing was applied. Patient was extubated taken to the PACU in stable condition plan for overnight observation.

## 2014-12-21 NOTE — H&P (Signed)
Theodore Dominguez is an 74 y.o. male.   Chief Complaint: Ulceration osteomyelitis cellulitis right forefoot HPI: Patient is a 74 year old gentleman status post first ray amputation last year who presents with progressive necrotic changes to the forefoot.  Past Medical History  Diagnosis Date  . Hypertension   . Cancer   . Prostate cancer     2002  . Cataract of both eyes     immature    Past Surgical History  Procedure Laterality Date  . Prostate surgery    . Colonoscopy    . Polypectomy    . Cyst on head      back of head/ 2011  . Amputation Right 03/23/2014    Procedure: 1st Ray Amputation Right Foot;  Surgeon: Newt Minion, MD;  Location: [facility];  Service: Orthopedics;  Laterality: Right;    Family History  Problem Relation Age of Onset  . Colon cancer Neg Hx    Social History:  reports that he has never smoked. He has never used smokeless tobacco. He reports that he drinks alcohol. He reports that he does not use illicit drugs.  Allergies: No Known Allergies  No prescriptions prior to admission    No results found for this or any previous visit (from the past 48 hour(s)). No results found.  Review of Systems  All other systems reviewed and are negative.   There were no vitals taken for this visit. Physical Exam  On examination the first ray amputation has healed nicely. Patient has developed progressive ulceration osteomyelitis and cellulitis of the right forefoot. Assessment/Plan Assessment: Ulceration osteomyelitis cellulitis right forefoot.  Plan: We will plan for right transmetatarsal amputation.  Theodore Dominguez 12/21/2014, 6:42 AM

## 2014-12-21 NOTE — Anesthesia Preprocedure Evaluation (Signed)
Anesthesia Evaluation  Patient identified by MRN, date of birth, ID band Patient awake    Reviewed: Allergy & Precautions, NPO status , Patient's Chart, lab work & pertinent test results  Airway Mallampati: I  TM Distance: >3 FB Neck ROM: Full    Dental   Pulmonary    Pulmonary exam normal       Cardiovascular hypertension, Pt. on medications Normal cardiovascular exam    Neuro/Psych    GI/Hepatic   Endo/Other    Renal/GU      Musculoskeletal   Abdominal   Peds  Hematology   Anesthesia Other Findings   Reproductive/Obstetrics                             Anesthesia Physical Anesthesia Plan  ASA: III  Anesthesia Plan: General   Post-op Pain Management:    Induction: Intravenous  Airway Management Planned: LMA  Additional Equipment:   Intra-op Plan:   Post-operative Plan: Extubation in OR  Informed Consent: I have reviewed the patients History and Physical, chart, labs and discussed the procedure including the risks, benefits and alternatives for the proposed anesthesia with the patient or authorized representative who has indicated his/her understanding and acceptance.     Plan Discussed with: CRNA and Surgeon  Anesthesia Plan Comments:         Anesthesia Quick Evaluation

## 2014-12-22 DIAGNOSIS — I10 Essential (primary) hypertension: Secondary | ICD-10-CM | POA: Diagnosis not present

## 2014-12-22 DIAGNOSIS — Z8546 Personal history of malignant neoplasm of prostate: Secondary | ICD-10-CM | POA: Diagnosis not present

## 2014-12-22 DIAGNOSIS — M868X7 Other osteomyelitis, ankle and foot: Secondary | ICD-10-CM | POA: Diagnosis not present

## 2014-12-22 DIAGNOSIS — R262 Difficulty in walking, not elsewhere classified: Secondary | ICD-10-CM | POA: Diagnosis not present

## 2014-12-22 DIAGNOSIS — L03115 Cellulitis of right lower limb: Secondary | ICD-10-CM | POA: Diagnosis not present

## 2014-12-22 NOTE — Evaluation (Signed)
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 Pt currently with functional limitations due to the deficits listed below (see PT Problem List). Pt will benefit from skilled PT to increase their independence and safety with mobility to allow discharge to the venue listed below.  Pt's bedroom is on 2nd floor of house and would benefit from continued education on NWB with stairs depending on progress in acute care.  Although, pt may end up staying in son's bedroom on first floor.     Follow Up Recommendations Home health PT- further stair training with NWB (bedroom on 2nd floor)    Equipment Recommendations  None recommended by PT    Recommendations for Other Services       Precautions / Restrictions Precautions Precautions: Fall Required Braces or Orthoses: Other Brace/Splint Other Brace/Splint: post-op shoe Restrictions RLE Weight Bearing: Non weight bearing      Mobility  Bed Mobility Overal bed mobility: Modified Independent                Transfers Overall transfer level: Needs assistance Equipment used: Rolling walker (2 wheeled) Transfers: Sit to/from Stand Sit to Stand: Supervision         General transfer comment: cues for hand placement  Ambulation/Gait Ambulation/Gait assistance: Min guard Ambulation Distance (Feet): 65 Feet (x2) Assistive device: Rolling walker (2 wheeled) Gait Pattern/deviations: Step-through pattern;Step-to pattern     General Gait Details: Cues to stay within RW due to increased L step length past front of RW  Stairs Stairs: Yes Stairs assistance: Min guard Stair Management: With crutches Number of Stairs: 3 General stair comments: Pt educated on different approach to stairs and he would prefer crutches  to backwards with RW. Practiced and did well with ascent, but increased difficulty with descent requiring MIN A due to NWB. Pt educated to always have someone with him and discussed use of crutch and rail for flight of stairs in house, vs. scooting on bottom vs. staying in son's bedroom on first floor for 2 weeks.  Wheelchair Mobility    Modified Rankin (Stroke Patients Only)       Balance Overall balance assessment: Needs assistance   Sitting balance-Leahy Scale: Good       Standing balance-Leahy Scale: Poor Standing balance comment: requires RW or crutches due to R LE NWB                             Pertinent Vitals/Pain Pain Assessment: No/denies pain    Home Living Family/patient expects to be discharged to:: Private residence Living Arrangements: Children Available Help at Discharge: Available PRN/intermittently;Family Type of Home: House Home Access: Stairs to enter Entrance Stairs-Rails: None Technical brewer of Steps: 2 Home Layout: Two level;Bed/bath upstairs Home Equipment: Walker - 2 wheels;Crutches      Prior Function                 Hand Dominance        Extremity/Trunk Assessment   Upper Extremity Assessment: Overall WFL for tasks assessed           Lower Extremity Assessment: Overall WFL for tasks assessed      Cervical / Trunk Assessment: Normal  Communication   Communication: No difficulties  Cognition Arousal/Alertness: Awake/alert Behavior During  Therapy: WFL for tasks assessed/performed Overall Cognitive Status: Within Functional Limits for tasks assessed                      General Comments General comments (skin integrity, edema, etc.): Pt used crutches last time he had surgery, but couldn't clarify if he was NWB or not.    Exercises        Assessment/Plan    PT Assessment Patient needs continued PT services  PT Diagnosis Difficulty walking   PT Problem List Decreased balance;Decreased  mobility;Decreased knowledge of use of DME  PT Treatment Interventions Gait training;Stair training;Functional mobility training;Therapeutic activities;Balance training   PT Goals (Current goals can be found in the Care Plan section) Acute Rehab PT Goals Patient Stated Goal: go home PT Goal Formulation: With patient Time For Goal Achievement: 12/29/14 Potential to Achieve Goals: Good    Frequency Min 3X/week   Barriers to discharge        Co-evaluation               End of Session Equipment Utilized During Treatment: Gait belt Activity Tolerance: Patient tolerated treatment well Patient left: in chair;with call bell/phone within reach Nurse Communication: Mobility status         Time: 0903-0950 PT Time Calculation (min) (ACUTE ONLY): 47 min   Charges:   PT Evaluation $Initial PT Evaluation Tier I: 1 Procedure PT Treatments $Gait Training: 23-37 mins   PT G Codes:        Dvontae Ruan LUBECK 12/22/2014, 10:06 AM

## 2014-12-22 NOTE — Care Management Note (Signed)
Case Management Note  Patient Details  Name: SERAPHIM TROW MRN: [id number] Date of Birth: 10-04-1940  Subjective/Objective:                  status post first ray amputation last year who presents with progressive necrotic changes to the forefoot.   Action/Plan: Right Transmetatarsal Amputation  Expected Discharge Date:                  Expected Discharge Plan:  [place]  In-House Referral:     Discharge planning Services  CM Consult  Post Acute Care Choice:  Home Health Choice offered to:  Patient  DME Arranged:    DME Agency:     HH Arranged:  PT [place]:  [place]  Status of Service:  Pending completion  Medicare Important Message Given:    Date Medicare IM Given:    Medicare IM give by:    Date Additional Medicare IM Given:    Additional Medicare Important Message give by:     If discussed at [place] of Stay Meetings, dates discussed:    Additional Comments:  Presented patient with [facility] agency list after conferring with wife and family chose [facility] agency.  Rep notified and HHPT set up for patient.  At time no HH or Face to Face in chart, nurse made aware to contact MD for orders for [facility] agency.  No other CM needs at this time.  As of 642pm orders still not in chart [facility] rep asking that orders be emailed.  Will leave request for CM to follow up.    Corine Shelter, RN 12/22/2014, 6:39 PM

## 2014-12-22 NOTE — Progress Notes (Signed)
   Subjective:  Patient reports pain as none.    Objective:   VITALS:   Filed Vitals:   12/21/14 1830 12/21/14 1851 12/21/14 2013 12/22/14 0442  BP: 136/64 133/70 137/62 117/59  Pulse: 74 73 71 78  Temp: 97.9 F (36.6 C) 97.8 F (36.6 C) 97.5 F (36.4 C) 99.1 F (37.3 C)  TempSrc:      Resp: 13 16 16 16   Height:      Weight:      SpO2: 99% 95% 97% 99%    Neurologically intact Neurovascular intact Sensation intact distally Intact pulses distally Dorsiflexion/Plantar flexion intact Incision: dressing C/D/I and no drainage No cellulitis present Compartment soft   Lab Results  Component Value Date   WBC 13.8* 12/21/2014   HGB 13.6 12/21/2014   HCT 39.9 12/21/2014   MCV 87.5 12/21/2014   PLT 257 12/21/2014     Assessment/Plan:  1 Day Post-Op   - Expected postop acute blood loss anemia - will monitor for symptoms - Up with PT/OT - DVT ppx - SCDs, ambulation, asa - NWB operative extremity - Pain control - Discharge planning - home today  Marianna Payment 12/22/2014, 8:00 AM [phone]

## 2014-12-22 NOTE — Progress Notes (Signed)
PT note: Late entry for G code  12/23/14 1009  PT G-Codes **NOT FOR INPATIENT CLASS**  Functional Assessment Tool Used clinical judgement  Functional Limitation Mobility: Walking and moving around  Mobility: Walking and Moving Around Current Status (P1225) CI  Mobility: Walking and Moving Around Goal Status [phone]) [facility]

## 2014-12-23 NOTE — Discharge Summary (Signed)
Physician Discharge Summary  Patient ID: Theodore Dominguez MRN: [id number] DOB/AGE: 75-Oct-1942 75 y.o.  Admit date: 12/21/2014 Discharge date: 12/23/2014  Admission Diagnoses: Gangrene forefoot  Discharge Diagnoses:  Active Problems:   Amputation at midfoot   Discharged Condition: stable  Hospital Course: Patient's hospital course was essentially unremarkable. He underwent transmetatarsal amputation and was discharged to home.  Consults: None  Significant Diagnostic Studies: labs: Routine labs  Treatments: surgery: See operative note  Discharge Exam: Blood pressure 117/59, pulse 78, temperature 99.1 F (37.3 C), temperature source Oral, resp. rate 16, height 5\' 9"  (1.753 m), weight 83.915 kg (185 lb), SpO2 99 %. Incision/Wound: dressing clean and dry  Disposition: 01-Home or Self Care  Discharge Instructions    Call MD / Call 911    Complete by:  As directed   If you experience chest pain or shortness of breath, CALL 911 and be transported to the hospital emergency room.  If you develope a fever above 101 F, pus (white drainage) or increased drainage or redness at the wound, or calf pain, call your surgeon's office.     Call MD / Call 911    Complete by:  As directed   If you experience chest pain or shortness of breath, CALL 911 and be transported to the hospital emergency room.  If you develope a fever above 101.5 F, pus (white drainage) or increased drainage or redness at the wound, or calf pain, call your surgeon's office.     Constipation Prevention    Complete by:  As directed   Drink plenty of fluids.  Prune juice may be helpful.  You may use a stool softener, such as Colace (over the counter) 100 mg twice a day.  Use MiraLax (over the counter) for constipation as needed.     Constipation Prevention    Complete by:  As directed   Drink plenty of fluids.  Prune juice may be helpful.  You may use a stool softener, such as Colace (over the counter) 100 mg twice a day.  Use  MiraLax (over the counter) for constipation as needed.     Diet - low sodium heart healthy    Complete by:  As directed      Diet - low sodium heart healthy    Complete by:  As directed      Diet general    Complete by:  As directed      Driving restrictions    Complete by:  As directed   No driving while taking narcotic pain meds.     Increase activity slowly as tolerated    Complete by:  As directed      Increase activity slowly as tolerated    Complete by:  As directed      Touch down weight bearing    Complete by:  As directed   Laterality:  right  Extremity:  Lower            Medication List    TAKE these medications        aspirin 81 MG tablet  Take 81 mg by mouth daily.     aspirin EC 325 MG tablet  Take 1 tablet (325 mg total) by mouth daily.     calcium-vitamin D 500-200 MG-UNIT per tablet  Commonly known as:  OSCAL WITH D  Take 1 tablet by mouth daily.     doxycycline 100 MG capsule  Commonly known as:  VIBRAMYCIN  Take 100 mg by mouth 2 (  two) times daily. For 15 days     fish oil-omega-3 fatty acids 1000 MG capsule  Take 1 g by mouth daily.     HYDROcodone-acetaminophen 5-325 MG per tablet  Commonly known as:  NORCO  Take 1 tablet by mouth every 6 (six) hours as needed.     lisinopril-hydrochlorothiazide 20-25 MG per tablet  Commonly known as:  PRINZIDE,ZESTORETIC  Take 1 tablet by mouth every morning.     metoprolol succinate 50 MG 24 hr tablet  Commonly known as:  TOPROL-XL  Take 50 mg by mouth daily. Take with or immediately following a meal.     multivitamin tablet  Take 1 tablet by mouth daily. Senior MVI-Take one daily     vitamin C 1000 MG tablet  Take 1,000 mg by mouth daily.     vitamin E 400 UNIT capsule  Take 400 Units by mouth daily.           Follow-up Information    Follow up with DUDA,MARCUS V, MD In 2 weeks.   Specialty:  Orthopedic Surgery   Contact information:   [place]  [address] [phone]       Follow up with [facility].   Why:  PATIENT SETUP WITH [facility] FOR HHPT.     Contact information:   [address] [address] [phone]       Signed: Newt Minion 12/23/2014, 9:20 AM

## 2014-12-24 ENCOUNTER — Encounter (HOSPITAL_COMMUNITY): Payer: Self-pay | Admitting: Orthopedic Surgery

## 2014-12-24 DIAGNOSIS — I10 Essential (primary) hypertension: Secondary | ICD-10-CM | POA: Diagnosis not present

## 2014-12-24 DIAGNOSIS — Z4781 Encounter for orthopedic aftercare following surgical amputation: Secondary | ICD-10-CM | POA: Diagnosis not present

## 2014-12-24 DIAGNOSIS — Z9181 History of falling: Secondary | ICD-10-CM | POA: Diagnosis not present

## 2014-12-24 DIAGNOSIS — Z89421 Acquired absence of other right toe(s): Secondary | ICD-10-CM | POA: Diagnosis not present

## 2014-12-24 DIAGNOSIS — M86171 Other acute osteomyelitis, right ankle and foot: Secondary | ICD-10-CM | POA: Diagnosis not present

## 2014-12-26 DIAGNOSIS — M86171 Other acute osteomyelitis, right ankle and foot: Secondary | ICD-10-CM | POA: Diagnosis not present

## 2014-12-26 DIAGNOSIS — I10 Essential (primary) hypertension: Secondary | ICD-10-CM | POA: Diagnosis not present

## 2014-12-26 DIAGNOSIS — Z89421 Acquired absence of other right toe(s): Secondary | ICD-10-CM | POA: Diagnosis not present

## 2014-12-26 DIAGNOSIS — Z4781 Encounter for orthopedic aftercare following surgical amputation: Secondary | ICD-10-CM | POA: Diagnosis not present

## 2014-12-26 DIAGNOSIS — Z9181 History of falling: Secondary | ICD-10-CM | POA: Diagnosis not present

## 2014-12-31 DIAGNOSIS — Z4781 Encounter for orthopedic aftercare following surgical amputation: Secondary | ICD-10-CM | POA: Diagnosis not present

## 2014-12-31 DIAGNOSIS — M86171 Other acute osteomyelitis, right ankle and foot: Secondary | ICD-10-CM | POA: Diagnosis not present

## 2014-12-31 DIAGNOSIS — Z89421 Acquired absence of other right toe(s): Secondary | ICD-10-CM | POA: Diagnosis not present

## 2014-12-31 DIAGNOSIS — I10 Essential (primary) hypertension: Secondary | ICD-10-CM | POA: Diagnosis not present

## 2014-12-31 DIAGNOSIS — Z9181 History of falling: Secondary | ICD-10-CM | POA: Diagnosis not present

## 2015-01-02 DIAGNOSIS — M86171 Other acute osteomyelitis, right ankle and foot: Secondary | ICD-10-CM | POA: Diagnosis not present

## 2015-01-02 DIAGNOSIS — I10 Essential (primary) hypertension: Secondary | ICD-10-CM | POA: Diagnosis not present

## 2015-01-02 DIAGNOSIS — Z4781 Encounter for orthopedic aftercare following surgical amputation: Secondary | ICD-10-CM | POA: Diagnosis not present

## 2015-01-02 DIAGNOSIS — Z9181 History of falling: Secondary | ICD-10-CM | POA: Diagnosis not present

## 2015-01-02 DIAGNOSIS — Z89421 Acquired absence of other right toe(s): Secondary | ICD-10-CM | POA: Diagnosis not present

## 2015-01-08 DIAGNOSIS — Z9181 History of falling: Secondary | ICD-10-CM | POA: Diagnosis not present

## 2015-01-08 DIAGNOSIS — Z4781 Encounter for orthopedic aftercare following surgical amputation: Secondary | ICD-10-CM | POA: Diagnosis not present

## 2015-01-08 DIAGNOSIS — Z89421 Acquired absence of other right toe(s): Secondary | ICD-10-CM | POA: Diagnosis not present

## 2015-01-08 DIAGNOSIS — I10 Essential (primary) hypertension: Secondary | ICD-10-CM | POA: Diagnosis not present

## 2015-01-08 DIAGNOSIS — M86171 Other acute osteomyelitis, right ankle and foot: Secondary | ICD-10-CM | POA: Diagnosis not present

## 2015-01-09 DIAGNOSIS — Z9181 History of falling: Secondary | ICD-10-CM | POA: Diagnosis not present

## 2015-01-09 DIAGNOSIS — I10 Essential (primary) hypertension: Secondary | ICD-10-CM | POA: Diagnosis not present

## 2015-01-09 DIAGNOSIS — Z89421 Acquired absence of other right toe(s): Secondary | ICD-10-CM | POA: Diagnosis not present

## 2015-01-09 DIAGNOSIS — Z4781 Encounter for orthopedic aftercare following surgical amputation: Secondary | ICD-10-CM | POA: Diagnosis not present

## 2015-01-09 DIAGNOSIS — M86171 Other acute osteomyelitis, right ankle and foot: Secondary | ICD-10-CM | POA: Diagnosis not present

## 2015-01-11 DIAGNOSIS — Z89421 Acquired absence of other right toe(s): Secondary | ICD-10-CM | POA: Diagnosis not present

## 2015-01-11 DIAGNOSIS — M86171 Other acute osteomyelitis, right ankle and foot: Secondary | ICD-10-CM | POA: Diagnosis not present

## 2015-01-11 DIAGNOSIS — I10 Essential (primary) hypertension: Secondary | ICD-10-CM | POA: Diagnosis not present

## 2015-01-11 DIAGNOSIS — Z9181 History of falling: Secondary | ICD-10-CM | POA: Diagnosis not present

## 2015-01-11 DIAGNOSIS — Z4781 Encounter for orthopedic aftercare following surgical amputation: Secondary | ICD-10-CM | POA: Diagnosis not present

## 2015-01-14 DIAGNOSIS — Z4781 Encounter for orthopedic aftercare following surgical amputation: Secondary | ICD-10-CM | POA: Diagnosis not present

## 2015-01-14 DIAGNOSIS — Z9181 History of falling: Secondary | ICD-10-CM | POA: Diagnosis not present

## 2015-01-14 DIAGNOSIS — Z89421 Acquired absence of other right toe(s): Secondary | ICD-10-CM | POA: Diagnosis not present

## 2015-01-14 DIAGNOSIS — I10 Essential (primary) hypertension: Secondary | ICD-10-CM | POA: Diagnosis not present

## 2015-01-14 DIAGNOSIS — M86171 Other acute osteomyelitis, right ankle and foot: Secondary | ICD-10-CM | POA: Diagnosis not present

## 2015-01-16 DIAGNOSIS — Z89421 Acquired absence of other right toe(s): Secondary | ICD-10-CM | POA: Diagnosis not present

## 2015-01-16 DIAGNOSIS — Z9181 History of falling: Secondary | ICD-10-CM | POA: Diagnosis not present

## 2015-01-16 DIAGNOSIS — Z4781 Encounter for orthopedic aftercare following surgical amputation: Secondary | ICD-10-CM | POA: Diagnosis not present

## 2015-01-16 DIAGNOSIS — M86171 Other acute osteomyelitis, right ankle and foot: Secondary | ICD-10-CM | POA: Diagnosis not present

## 2015-01-16 DIAGNOSIS — I10 Essential (primary) hypertension: Secondary | ICD-10-CM | POA: Diagnosis not present

## 2015-01-17 DIAGNOSIS — Z89421 Acquired absence of other right toe(s): Secondary | ICD-10-CM | POA: Diagnosis not present

## 2015-01-17 DIAGNOSIS — Z9181 History of falling: Secondary | ICD-10-CM | POA: Diagnosis not present

## 2015-01-17 DIAGNOSIS — I10 Essential (primary) hypertension: Secondary | ICD-10-CM | POA: Diagnosis not present

## 2015-01-17 DIAGNOSIS — Z4781 Encounter for orthopedic aftercare following surgical amputation: Secondary | ICD-10-CM | POA: Diagnosis not present

## 2015-01-17 DIAGNOSIS — M86171 Other acute osteomyelitis, right ankle and foot: Secondary | ICD-10-CM | POA: Diagnosis not present

## 2015-01-21 DIAGNOSIS — I10 Essential (primary) hypertension: Secondary | ICD-10-CM | POA: Diagnosis not present

## 2015-01-21 DIAGNOSIS — M86171 Other acute osteomyelitis, right ankle and foot: Secondary | ICD-10-CM | POA: Diagnosis not present

## 2015-01-21 DIAGNOSIS — R221 Localized swelling, mass and lump, neck: Secondary | ICD-10-CM | POA: Diagnosis not present

## 2015-01-21 DIAGNOSIS — Z4781 Encounter for orthopedic aftercare following surgical amputation: Secondary | ICD-10-CM | POA: Diagnosis not present

## 2015-01-21 DIAGNOSIS — Z89421 Acquired absence of other right toe(s): Secondary | ICD-10-CM | POA: Diagnosis not present

## 2015-01-21 DIAGNOSIS — Z9181 History of falling: Secondary | ICD-10-CM | POA: Diagnosis not present

## 2015-01-23 DIAGNOSIS — Z4781 Encounter for orthopedic aftercare following surgical amputation: Secondary | ICD-10-CM | POA: Diagnosis not present

## 2015-01-23 DIAGNOSIS — Z89421 Acquired absence of other right toe(s): Secondary | ICD-10-CM | POA: Diagnosis not present

## 2015-01-23 DIAGNOSIS — M86171 Other acute osteomyelitis, right ankle and foot: Secondary | ICD-10-CM | POA: Diagnosis not present

## 2015-01-23 DIAGNOSIS — I10 Essential (primary) hypertension: Secondary | ICD-10-CM | POA: Diagnosis not present

## 2015-01-23 DIAGNOSIS — Z9181 History of falling: Secondary | ICD-10-CM | POA: Diagnosis not present

## 2015-03-20 DIAGNOSIS — H25813 Combined forms of age-related cataract, bilateral: Secondary | ICD-10-CM | POA: Diagnosis not present

## 2015-03-20 DIAGNOSIS — H524 Presbyopia: Secondary | ICD-10-CM | POA: Diagnosis not present

## 2015-03-20 DIAGNOSIS — D2311 Other benign neoplasm of skin of right eyelid, including canthus: Secondary | ICD-10-CM | POA: Diagnosis not present

## 2015-03-20 DIAGNOSIS — H43813 Vitreous degeneration, bilateral: Secondary | ICD-10-CM | POA: Diagnosis not present

## 2015-04-11 DIAGNOSIS — Z23 Encounter for immunization: Secondary | ICD-10-CM | POA: Diagnosis not present

## 2015-07-30 DIAGNOSIS — Z8546 Personal history of malignant neoplasm of prostate: Secondary | ICD-10-CM | POA: Diagnosis not present

## 2015-07-30 DIAGNOSIS — N5201 Erectile dysfunction due to arterial insufficiency: Secondary | ICD-10-CM | POA: Diagnosis not present

## 2015-07-30 DIAGNOSIS — Z0000 Encounter for general adult medical examination without abnormal findings: Secondary | ICD-10-CM | POA: Diagnosis not present

## 2015-07-30 DIAGNOSIS — R351 Nocturia: Secondary | ICD-10-CM | POA: Diagnosis not present

## 2015-08-16 DIAGNOSIS — R739 Hyperglycemia, unspecified: Secondary | ICD-10-CM | POA: Diagnosis not present

## 2015-08-16 DIAGNOSIS — Z0000 Encounter for general adult medical examination without abnormal findings: Secondary | ICD-10-CM | POA: Diagnosis not present

## 2015-08-16 DIAGNOSIS — I10 Essential (primary) hypertension: Secondary | ICD-10-CM | POA: Diagnosis not present

## 2015-08-16 DIAGNOSIS — E782 Mixed hyperlipidemia: Secondary | ICD-10-CM | POA: Diagnosis not present

## 2016-02-06 ENCOUNTER — Other Ambulatory Visit: Payer: Self-pay

## 2016-03-23 DIAGNOSIS — H25813 Combined forms of age-related cataract, bilateral: Secondary | ICD-10-CM | POA: Diagnosis not present

## 2016-03-23 DIAGNOSIS — H52203 Unspecified astigmatism, bilateral: Secondary | ICD-10-CM | POA: Diagnosis not present

## 2016-03-23 DIAGNOSIS — D2311 Other benign neoplasm of skin of right eyelid, including canthus: Secondary | ICD-10-CM | POA: Diagnosis not present

## 2016-03-23 DIAGNOSIS — H5213 Myopia, bilateral: Secondary | ICD-10-CM | POA: Diagnosis not present

## 2016-03-25 DIAGNOSIS — Z23 Encounter for immunization: Secondary | ICD-10-CM | POA: Diagnosis not present

## 2016-04-10 IMAGING — CR DG FOOT COMPLETE 3+V*R*
3 series · 3 of 3 positions shown · non-contrast
Comparison: 11/27/2013 radiographs

CLINICAL DATA: 73-year-old male with right foot ulcer, pain and
swelling..

EXAM:
RIGHT FOOT COMPLETE - 3+ VIEW

[t foot ap right]
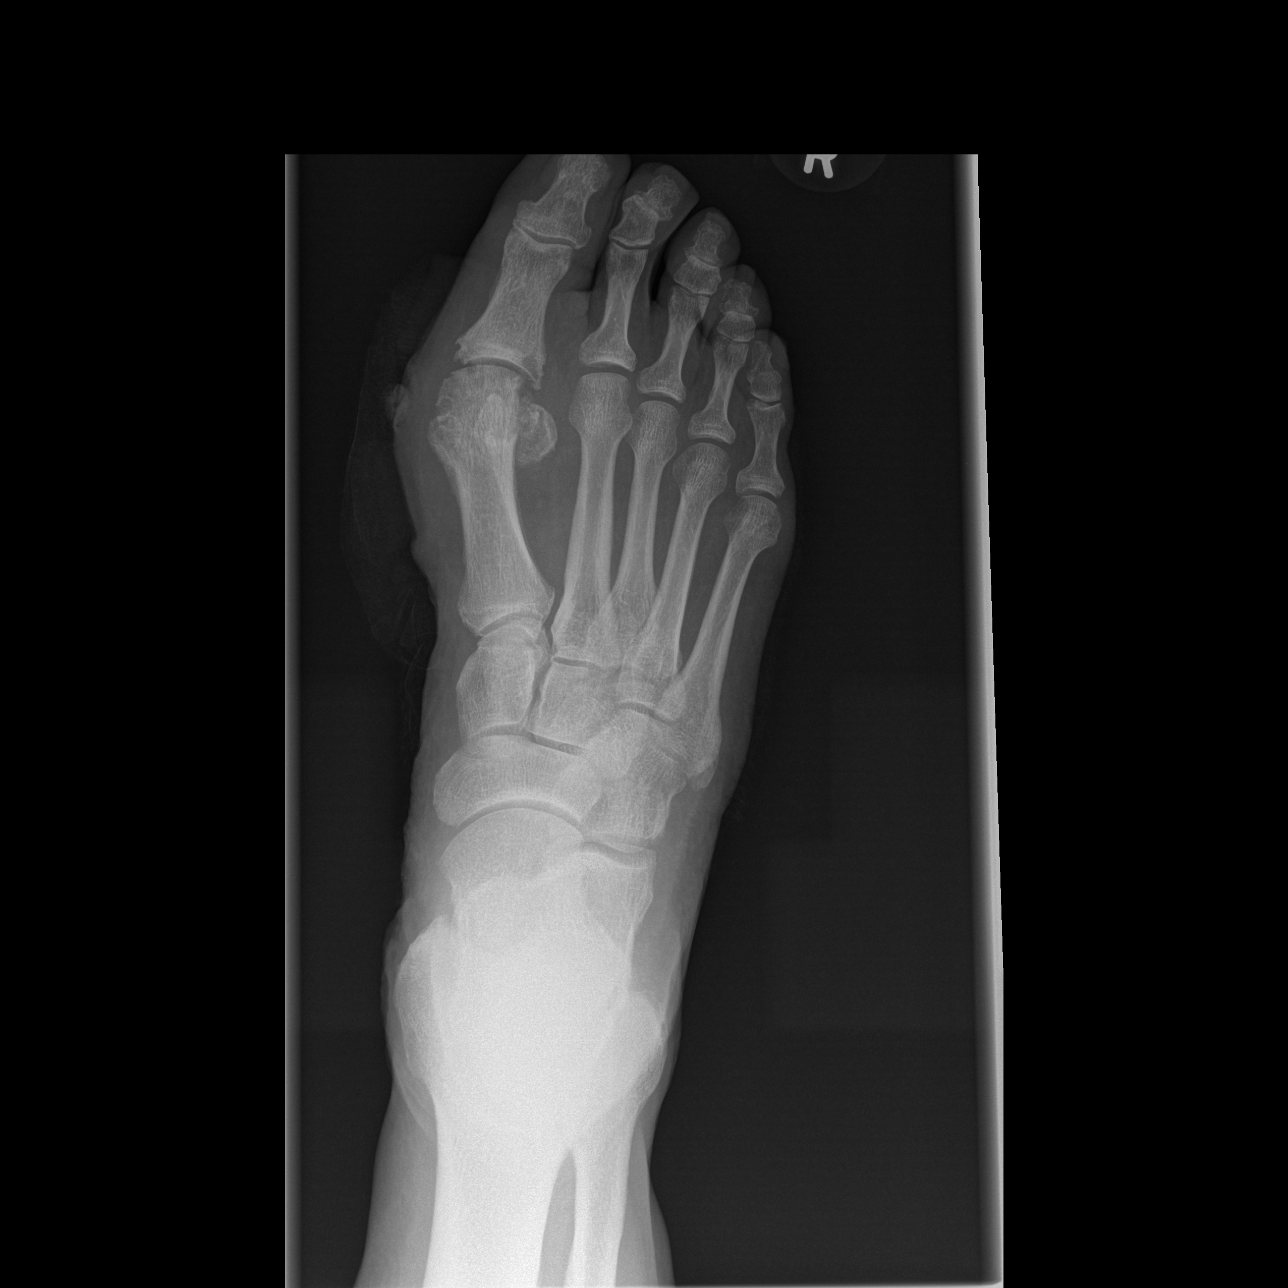

[t foot oblique right]
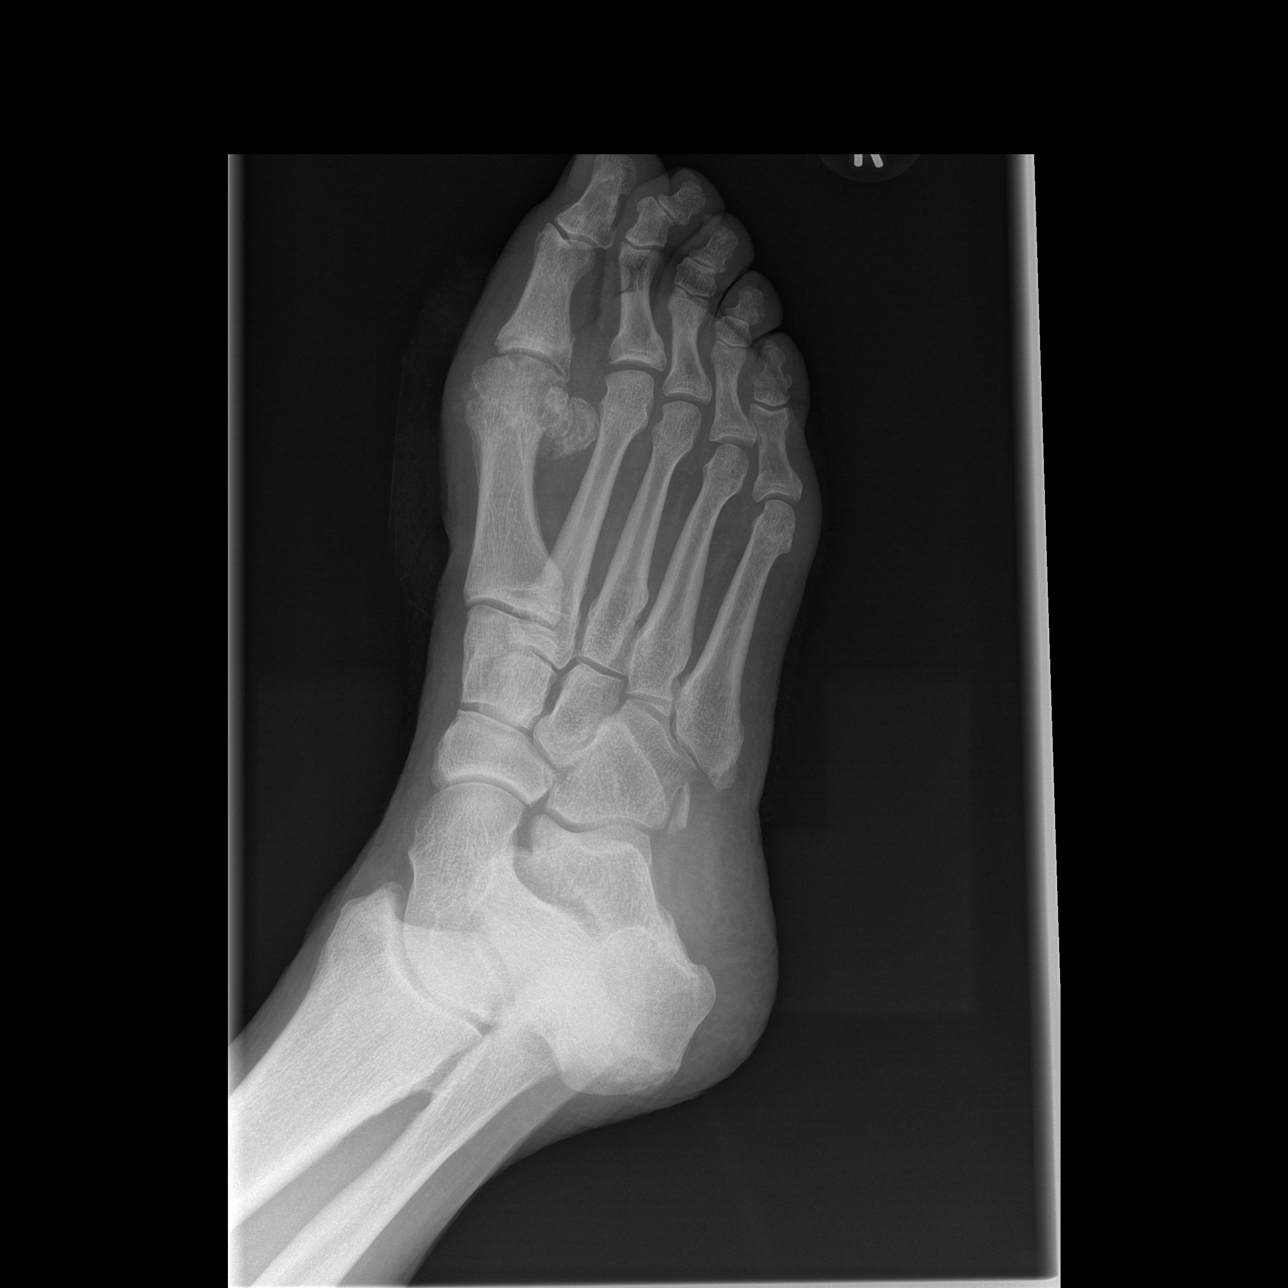

[t foot lat right]
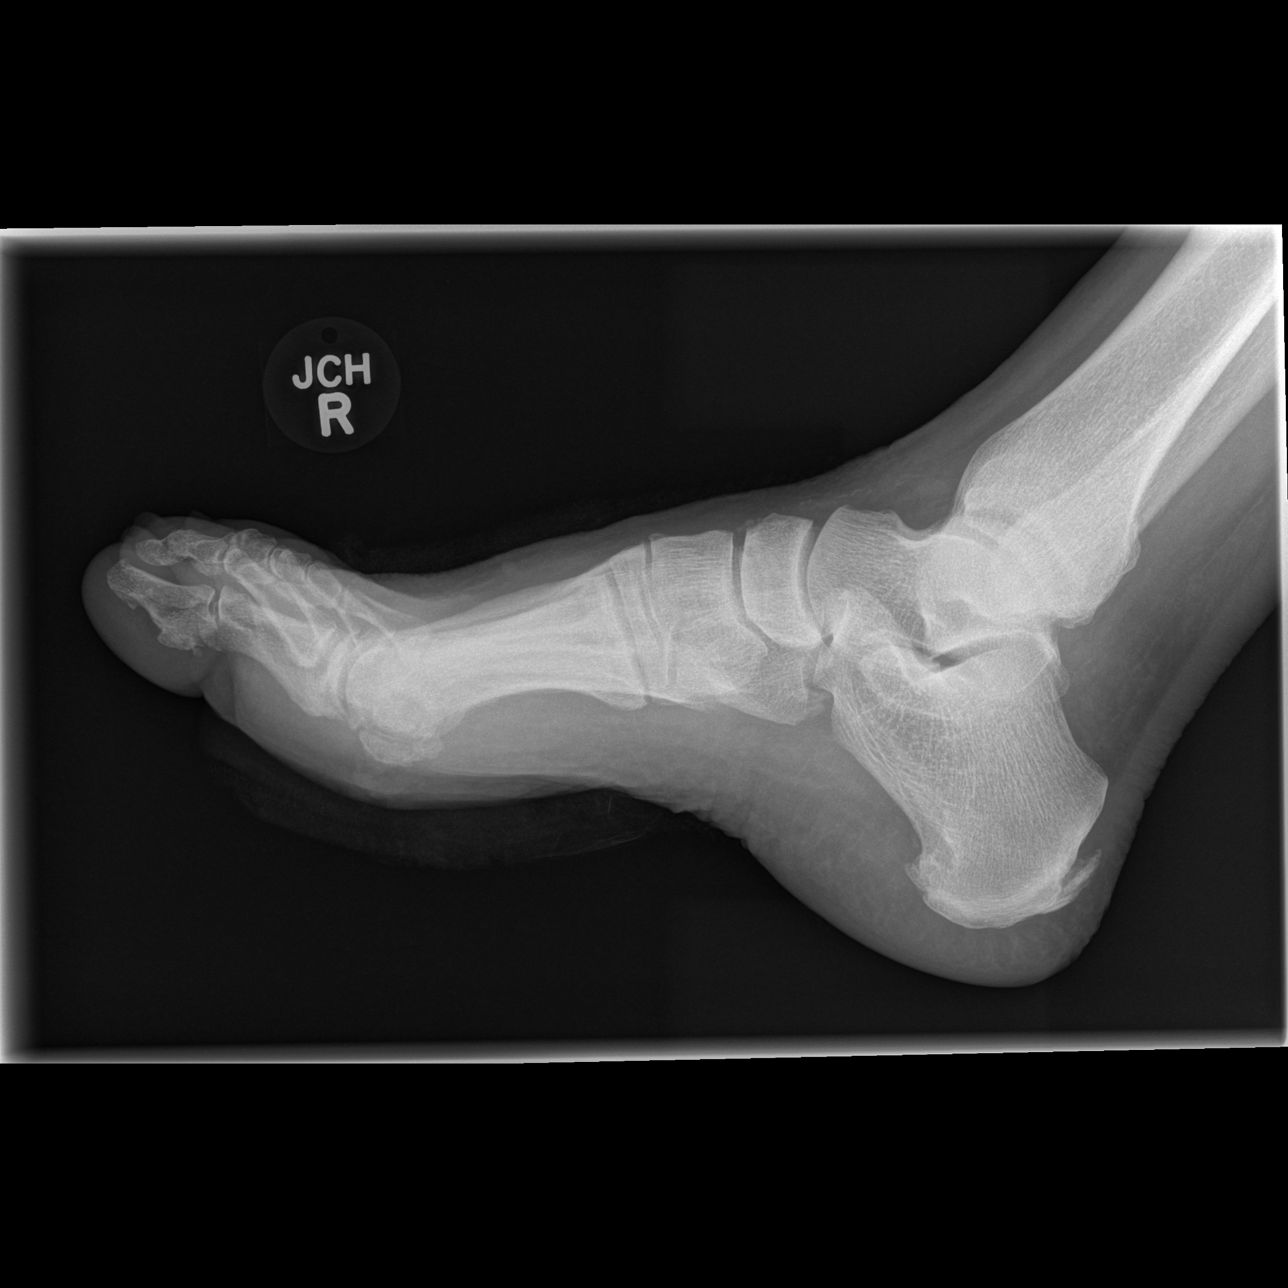

[3 of 3 positions shown; findings below may reference images not displayed]

FINDINGS: Soft tissue swelling overlying the first MTP joint noted.

Erosive changes with sclerotic margins along the first MTP joint may
represent gouty changes.

There is no evidence of acute fracture, subluxation or dislocation.

No definite radiographic evidence of osteomyelitis noted.

The Lisfranc joints are intact.
IMPRESSION: Soft tissue swelling overlying the first MTP joint without acute
bony abnormality. If there is strong clinical concern for
osteomyelitis, MRI is recommended.

Erosive changes along the first MTP joint -question gout.

## 2016-07-15 DIAGNOSIS — Z8546 Personal history of malignant neoplasm of prostate: Secondary | ICD-10-CM | POA: Diagnosis not present

## 2016-07-22 DIAGNOSIS — C61 Malignant neoplasm of prostate: Secondary | ICD-10-CM | POA: Diagnosis not present

## 2016-07-22 DIAGNOSIS — N5201 Erectile dysfunction due to arterial insufficiency: Secondary | ICD-10-CM | POA: Diagnosis not present

## 2016-08-18 DIAGNOSIS — R7303 Prediabetes: Secondary | ICD-10-CM | POA: Diagnosis not present

## 2016-08-18 DIAGNOSIS — Z0000 Encounter for general adult medical examination without abnormal findings: Secondary | ICD-10-CM | POA: Diagnosis not present

## 2016-08-18 DIAGNOSIS — E782 Mixed hyperlipidemia: Secondary | ICD-10-CM | POA: Diagnosis not present

## 2016-08-18 DIAGNOSIS — I10 Essential (primary) hypertension: Secondary | ICD-10-CM | POA: Diagnosis not present

## 2016-09-10 DIAGNOSIS — Z86010 Personal history of colonic polyps: Secondary | ICD-10-CM | POA: Diagnosis not present

## 2016-10-15 DIAGNOSIS — D126 Benign neoplasm of colon, unspecified: Secondary | ICD-10-CM | POA: Diagnosis not present

## 2016-10-15 DIAGNOSIS — K635 Polyp of colon: Secondary | ICD-10-CM | POA: Diagnosis not present

## 2016-10-15 DIAGNOSIS — Z86010 Personal history of colonic polyps: Secondary | ICD-10-CM | POA: Diagnosis not present

## 2016-10-20 DIAGNOSIS — D126 Benign neoplasm of colon, unspecified: Secondary | ICD-10-CM | POA: Diagnosis not present

## 2016-10-20 DIAGNOSIS — K635 Polyp of colon: Secondary | ICD-10-CM | POA: Diagnosis not present

## 2016-10-23 ENCOUNTER — Encounter: Payer: Self-pay | Admitting: Gastroenterology

## 2017-03-12 DIAGNOSIS — Z23 Encounter for immunization: Secondary | ICD-10-CM | POA: Diagnosis not present

## 2017-05-28 DIAGNOSIS — H25813 Combined forms of age-related cataract, bilateral: Secondary | ICD-10-CM | POA: Diagnosis not present

## 2017-05-28 DIAGNOSIS — H5213 Myopia, bilateral: Secondary | ICD-10-CM | POA: Diagnosis not present

## 2017-05-28 DIAGNOSIS — D23112 Other benign neoplasm of skin of right lower eyelid, including canthus: Secondary | ICD-10-CM | POA: Diagnosis not present

## 2017-05-28 DIAGNOSIS — H524 Presbyopia: Secondary | ICD-10-CM | POA: Diagnosis not present

## 2017-08-23 DIAGNOSIS — Z136 Encounter for screening for cardiovascular disorders: Secondary | ICD-10-CM | POA: Diagnosis not present

## 2017-08-23 DIAGNOSIS — Z0000 Encounter for general adult medical examination without abnormal findings: Secondary | ICD-10-CM | POA: Diagnosis not present

## 2017-08-23 DIAGNOSIS — E782 Mixed hyperlipidemia: Secondary | ICD-10-CM | POA: Diagnosis not present

## 2017-08-23 DIAGNOSIS — R7303 Prediabetes: Secondary | ICD-10-CM | POA: Diagnosis not present

## 2017-08-23 DIAGNOSIS — I10 Essential (primary) hypertension: Secondary | ICD-10-CM | POA: Diagnosis not present

## 2017-10-04 ENCOUNTER — Ambulatory Visit (HOSPITAL_COMMUNITY)
Admission: RE | Admit: 2017-10-04 | Discharge: 2017-10-04 | Disposition: A | Discharge status: Home / Self Care | Payer: Medicare Other | Source: Ambulatory Visit | Attending: Internal Medicine | Admitting: Internal Medicine

## 2017-10-04 ENCOUNTER — Other Ambulatory Visit (HOSPITAL_BASED_OUTPATIENT_CLINIC_OR_DEPARTMENT_OTHER): Payer: Self-pay | Admitting: Internal Medicine

## 2017-10-04 ENCOUNTER — Other Ambulatory Visit (HOSPITAL_COMMUNITY)
Admission: RE | Admit: 2017-10-04 | Discharge: 2017-10-04 | Disposition: A | Discharge status: Home / Self Care | Payer: Medicare Other | Source: Other Acute Inpatient Hospital | Attending: Internal Medicine | Admitting: Internal Medicine

## 2017-10-04 ENCOUNTER — Encounter (HOSPITAL_BASED_OUTPATIENT_CLINIC_OR_DEPARTMENT_OTHER): Payer: Medicare Other | Attending: Internal Medicine

## 2017-10-04 DIAGNOSIS — L97522 Non-pressure chronic ulcer of other part of left foot with fat layer exposed: Secondary | ICD-10-CM | POA: Diagnosis not present

## 2017-10-04 DIAGNOSIS — B9561 Methicillin susceptible Staphylococcus aureus infection as the cause of diseases classified elsewhere: Secondary | ICD-10-CM | POA: Diagnosis not present

## 2017-10-04 DIAGNOSIS — L89893 Pressure ulcer of other site, stage 3: Secondary | ICD-10-CM | POA: Diagnosis not present

## 2017-10-04 DIAGNOSIS — L97529 Non-pressure chronic ulcer of other part of left foot with unspecified severity: Secondary | ICD-10-CM | POA: Diagnosis not present

## 2017-10-04 DIAGNOSIS — M86471 Chronic osteomyelitis with draining sinus, right ankle and foot: Secondary | ICD-10-CM | POA: Diagnosis not present

## 2017-10-04 DIAGNOSIS — Z8546 Personal history of malignant neoplasm of prostate: Secondary | ICD-10-CM | POA: Insufficient documentation

## 2017-10-04 DIAGNOSIS — Z89412 Acquired absence of left great toe: Secondary | ICD-10-CM | POA: Insufficient documentation

## 2017-10-04 DIAGNOSIS — M869 Osteomyelitis, unspecified: Secondary | ICD-10-CM

## 2017-10-04 DIAGNOSIS — Z89431 Acquired absence of right foot: Secondary | ICD-10-CM | POA: Diagnosis not present

## 2017-10-08 LAB — AEROBIC CULTURE W GRAM STAIN (SUPERFICIAL SPECIMEN)

## 2017-10-08 LAB — AEROBIC CULTURE  (SUPERFICIAL SPECIMEN): GRAM STAIN: NONE SEEN

## 2017-10-11 DIAGNOSIS — B9561 Methicillin susceptible Staphylococcus aureus infection as the cause of diseases classified elsewhere: Secondary | ICD-10-CM | POA: Diagnosis not present

## 2017-10-11 DIAGNOSIS — Z89412 Acquired absence of left great toe: Secondary | ICD-10-CM | POA: Diagnosis not present

## 2017-10-11 DIAGNOSIS — Z89431 Acquired absence of right foot: Secondary | ICD-10-CM | POA: Diagnosis not present

## 2017-10-11 DIAGNOSIS — L89893 Pressure ulcer of other site, stage 3: Secondary | ICD-10-CM | POA: Diagnosis not present

## 2017-10-11 DIAGNOSIS — Z8546 Personal history of malignant neoplasm of prostate: Secondary | ICD-10-CM | POA: Diagnosis not present

## 2017-10-11 DIAGNOSIS — L97522 Non-pressure chronic ulcer of other part of left foot with fat layer exposed: Secondary | ICD-10-CM | POA: Diagnosis not present

## 2017-10-11 DIAGNOSIS — M86471 Chronic osteomyelitis with draining sinus, right ankle and foot: Secondary | ICD-10-CM | POA: Diagnosis not present

## 2017-10-18 DIAGNOSIS — B9562 Methicillin resistant Staphylococcus aureus infection as the cause of diseases classified elsewhere: Secondary | ICD-10-CM | POA: Diagnosis not present

## 2017-10-18 DIAGNOSIS — L97522 Non-pressure chronic ulcer of other part of left foot with fat layer exposed: Secondary | ICD-10-CM | POA: Diagnosis not present

## 2017-10-18 DIAGNOSIS — Z89412 Acquired absence of left great toe: Secondary | ICD-10-CM | POA: Diagnosis not present

## 2017-10-18 DIAGNOSIS — S91105A Unspecified open wound of left lesser toe(s) without damage to nail, initial encounter: Secondary | ICD-10-CM | POA: Diagnosis not present

## 2017-10-18 DIAGNOSIS — M86672 Other chronic osteomyelitis, left ankle and foot: Secondary | ICD-10-CM | POA: Diagnosis not present

## 2017-10-18 DIAGNOSIS — B9561 Methicillin susceptible Staphylococcus aureus infection as the cause of diseases classified elsewhere: Secondary | ICD-10-CM | POA: Diagnosis not present

## 2017-10-18 DIAGNOSIS — M86471 Chronic osteomyelitis with draining sinus, right ankle and foot: Secondary | ICD-10-CM | POA: Diagnosis not present

## 2017-10-18 DIAGNOSIS — Z8546 Personal history of malignant neoplasm of prostate: Secondary | ICD-10-CM | POA: Diagnosis not present

## 2017-10-18 DIAGNOSIS — Z89431 Acquired absence of right foot: Secondary | ICD-10-CM | POA: Diagnosis not present

## 2017-11-01 ENCOUNTER — Encounter (HOSPITAL_BASED_OUTPATIENT_CLINIC_OR_DEPARTMENT_OTHER): Payer: Medicare Other | Attending: Internal Medicine

## 2017-11-01 DIAGNOSIS — I10 Essential (primary) hypertension: Secondary | ICD-10-CM | POA: Diagnosis not present

## 2017-11-01 DIAGNOSIS — M86471 Chronic osteomyelitis with draining sinus, right ankle and foot: Secondary | ICD-10-CM | POA: Insufficient documentation

## 2017-11-01 DIAGNOSIS — B9561 Methicillin susceptible Staphylococcus aureus infection as the cause of diseases classified elsewhere: Secondary | ICD-10-CM | POA: Diagnosis not present

## 2017-11-01 DIAGNOSIS — S91102A Unspecified open wound of left great toe without damage to nail, initial encounter: Secondary | ICD-10-CM | POA: Diagnosis not present

## 2017-11-01 DIAGNOSIS — L97522 Non-pressure chronic ulcer of other part of left foot with fat layer exposed: Secondary | ICD-10-CM | POA: Diagnosis not present

## 2017-11-01 DIAGNOSIS — M86672 Other chronic osteomyelitis, left ankle and foot: Secondary | ICD-10-CM | POA: Diagnosis not present

## 2017-11-15 DIAGNOSIS — L97522 Non-pressure chronic ulcer of other part of left foot with fat layer exposed: Secondary | ICD-10-CM | POA: Diagnosis not present

## 2017-11-15 DIAGNOSIS — S91105A Unspecified open wound of left lesser toe(s) without damage to nail, initial encounter: Secondary | ICD-10-CM | POA: Diagnosis not present

## 2017-11-15 DIAGNOSIS — M86471 Chronic osteomyelitis with draining sinus, right ankle and foot: Secondary | ICD-10-CM | POA: Diagnosis not present

## 2017-11-15 DIAGNOSIS — B9561 Methicillin susceptible Staphylococcus aureus infection as the cause of diseases classified elsewhere: Secondary | ICD-10-CM | POA: Diagnosis not present

## 2017-11-15 DIAGNOSIS — I10 Essential (primary) hypertension: Secondary | ICD-10-CM | POA: Diagnosis not present

## 2018-02-28 DIAGNOSIS — Z23 Encounter for immunization: Secondary | ICD-10-CM | POA: Diagnosis not present

## 2018-04-07 DIAGNOSIS — H3500 Unspecified background retinopathy: Secondary | ICD-10-CM | POA: Diagnosis not present

## 2018-04-07 DIAGNOSIS — H25813 Combined forms of age-related cataract, bilateral: Secondary | ICD-10-CM | POA: Diagnosis not present

## 2018-04-07 DIAGNOSIS — H5213 Myopia, bilateral: Secondary | ICD-10-CM | POA: Diagnosis not present

## 2018-07-25 DIAGNOSIS — N5201 Erectile dysfunction due to arterial insufficiency: Secondary | ICD-10-CM | POA: Diagnosis not present

## 2018-07-25 DIAGNOSIS — Z8546 Personal history of malignant neoplasm of prostate: Secondary | ICD-10-CM | POA: Diagnosis not present

## 2018-09-19 DIAGNOSIS — Z0000 Encounter for general adult medical examination without abnormal findings: Secondary | ICD-10-CM | POA: Diagnosis not present

## 2018-09-19 DIAGNOSIS — I10 Essential (primary) hypertension: Secondary | ICD-10-CM | POA: Diagnosis not present

## 2018-09-19 DIAGNOSIS — E782 Mixed hyperlipidemia: Secondary | ICD-10-CM | POA: Diagnosis not present

## 2018-09-19 DIAGNOSIS — C61 Malignant neoplasm of prostate: Secondary | ICD-10-CM | POA: Diagnosis not present

## 2018-09-19 DIAGNOSIS — R7303 Prediabetes: Secondary | ICD-10-CM | POA: Diagnosis not present

## 2018-10-25 DIAGNOSIS — I10 Essential (primary) hypertension: Secondary | ICD-10-CM | POA: Diagnosis not present

## 2018-10-25 DIAGNOSIS — R7303 Prediabetes: Secondary | ICD-10-CM | POA: Diagnosis not present

## 2018-10-25 DIAGNOSIS — E782 Mixed hyperlipidemia: Secondary | ICD-10-CM | POA: Diagnosis not present

## 2018-10-25 DIAGNOSIS — C61 Malignant neoplasm of prostate: Secondary | ICD-10-CM | POA: Diagnosis not present

## 2018-12-12 DIAGNOSIS — H5213 Myopia, bilateral: Secondary | ICD-10-CM | POA: Diagnosis not present

## 2018-12-12 DIAGNOSIS — H524 Presbyopia: Secondary | ICD-10-CM | POA: Diagnosis not present

## 2018-12-12 DIAGNOSIS — H25813 Combined forms of age-related cataract, bilateral: Secondary | ICD-10-CM | POA: Diagnosis not present

## 2019-08-14 DIAGNOSIS — H52203 Unspecified astigmatism, bilateral: Secondary | ICD-10-CM | POA: Diagnosis not present

## 2019-08-14 DIAGNOSIS — H5213 Myopia, bilateral: Secondary | ICD-10-CM | POA: Diagnosis not present

## 2019-08-14 DIAGNOSIS — D23111 Other benign neoplasm of skin of right upper eyelid, including canthus: Secondary | ICD-10-CM | POA: Diagnosis not present

## 2019-08-14 DIAGNOSIS — H25813 Combined forms of age-related cataract, bilateral: Secondary | ICD-10-CM | POA: Diagnosis not present

## 2019-09-07 DIAGNOSIS — R21 Rash and other nonspecific skin eruption: Secondary | ICD-10-CM | POA: Diagnosis not present

## 2019-09-25 DIAGNOSIS — I10 Essential (primary) hypertension: Secondary | ICD-10-CM | POA: Diagnosis not present

## 2019-09-25 DIAGNOSIS — R7303 Prediabetes: Secondary | ICD-10-CM | POA: Diagnosis not present

## 2019-09-25 DIAGNOSIS — E782 Mixed hyperlipidemia: Secondary | ICD-10-CM | POA: Diagnosis not present

## 2019-09-25 DIAGNOSIS — Z0000 Encounter for general adult medical examination without abnormal findings: Secondary | ICD-10-CM | POA: Diagnosis not present

## 2019-11-03 DIAGNOSIS — L308 Other specified dermatitis: Secondary | ICD-10-CM | POA: Diagnosis not present

## 2020-04-08 DIAGNOSIS — H25813 Combined forms of age-related cataract, bilateral: Secondary | ICD-10-CM | POA: Diagnosis not present

## 2020-04-08 DIAGNOSIS — D23111 Other benign neoplasm of skin of right upper eyelid, including canthus: Secondary | ICD-10-CM | POA: Diagnosis not present

## 2020-04-08 DIAGNOSIS — H02834 Dermatochalasis of left upper eyelid: Secondary | ICD-10-CM | POA: Diagnosis not present

## 2020-04-08 DIAGNOSIS — H02831 Dermatochalasis of right upper eyelid: Secondary | ICD-10-CM | POA: Diagnosis not present

## 2020-07-15 DIAGNOSIS — Z8546 Personal history of malignant neoplasm of prostate: Secondary | ICD-10-CM | POA: Diagnosis not present

## 2020-09-26 DIAGNOSIS — Z5181 Encounter for therapeutic drug level monitoring: Secondary | ICD-10-CM | POA: Diagnosis not present

## 2020-09-26 DIAGNOSIS — Z0000 Encounter for general adult medical examination without abnormal findings: Secondary | ICD-10-CM | POA: Diagnosis not present

## 2020-09-26 DIAGNOSIS — C61 Malignant neoplasm of prostate: Secondary | ICD-10-CM | POA: Diagnosis not present

## 2020-09-26 DIAGNOSIS — R7303 Prediabetes: Secondary | ICD-10-CM | POA: Diagnosis not present

## 2020-09-26 DIAGNOSIS — E782 Mixed hyperlipidemia: Secondary | ICD-10-CM | POA: Diagnosis not present

## 2020-09-26 DIAGNOSIS — Z23 Encounter for immunization: Secondary | ICD-10-CM | POA: Diagnosis not present

## 2020-09-26 DIAGNOSIS — I10 Essential (primary) hypertension: Secondary | ICD-10-CM | POA: Diagnosis not present

## 2020-10-22 DIAGNOSIS — Z23 Encounter for immunization: Secondary | ICD-10-CM | POA: Diagnosis not present

## 2020-12-03 DIAGNOSIS — H25013 Cortical age-related cataract, bilateral: Secondary | ICD-10-CM | POA: Diagnosis not present

## 2020-12-03 DIAGNOSIS — H524 Presbyopia: Secondary | ICD-10-CM | POA: Diagnosis not present

## 2020-12-03 DIAGNOSIS — H2513 Age-related nuclear cataract, bilateral: Secondary | ICD-10-CM | POA: Diagnosis not present

## 2020-12-03 DIAGNOSIS — H43813 Vitreous degeneration, bilateral: Secondary | ICD-10-CM | POA: Diagnosis not present

## 2021-06-09 DIAGNOSIS — U071 COVID-19: Secondary | ICD-10-CM | POA: Diagnosis not present

## 2021-06-25 DIAGNOSIS — I10 Essential (primary) hypertension: Secondary | ICD-10-CM | POA: Diagnosis not present

## 2021-06-25 DIAGNOSIS — C61 Malignant neoplasm of prostate: Secondary | ICD-10-CM | POA: Diagnosis not present

## 2021-06-25 DIAGNOSIS — E782 Mixed hyperlipidemia: Secondary | ICD-10-CM | POA: Diagnosis not present

## 2021-06-25 DIAGNOSIS — R7303 Prediabetes: Secondary | ICD-10-CM | POA: Diagnosis not present

## 2021-11-10 DIAGNOSIS — Z0000 Encounter for general adult medical examination without abnormal findings: Secondary | ICD-10-CM | POA: Diagnosis not present

## 2021-11-10 DIAGNOSIS — Z23 Encounter for immunization: Secondary | ICD-10-CM | POA: Diagnosis not present

## 2021-11-10 DIAGNOSIS — R7303 Prediabetes: Secondary | ICD-10-CM | POA: Diagnosis not present

## 2021-11-10 DIAGNOSIS — E782 Mixed hyperlipidemia: Secondary | ICD-10-CM | POA: Diagnosis not present

## 2021-11-10 DIAGNOSIS — I10 Essential (primary) hypertension: Secondary | ICD-10-CM | POA: Diagnosis not present

## 2021-12-05 DIAGNOSIS — H43813 Vitreous degeneration, bilateral: Secondary | ICD-10-CM | POA: Diagnosis not present

## 2021-12-05 DIAGNOSIS — H25013 Cortical age-related cataract, bilateral: Secondary | ICD-10-CM | POA: Diagnosis not present

## 2021-12-05 DIAGNOSIS — H2513 Age-related nuclear cataract, bilateral: Secondary | ICD-10-CM | POA: Diagnosis not present

## 2021-12-05 DIAGNOSIS — H524 Presbyopia: Secondary | ICD-10-CM | POA: Diagnosis not present

## 2022-02-05 DIAGNOSIS — Z23 Encounter for immunization: Secondary | ICD-10-CM | POA: Diagnosis not present

## 2022-03-05 DIAGNOSIS — Z23 Encounter for immunization: Secondary | ICD-10-CM | POA: Diagnosis not present

## 2022-08-05 DIAGNOSIS — Z8546 Personal history of malignant neoplasm of prostate: Secondary | ICD-10-CM | POA: Diagnosis not present

## 2022-12-07 DIAGNOSIS — H43813 Vitreous degeneration, bilateral: Secondary | ICD-10-CM | POA: Diagnosis not present

## 2022-12-07 DIAGNOSIS — H524 Presbyopia: Secondary | ICD-10-CM | POA: Diagnosis not present

## 2022-12-07 DIAGNOSIS — H5213 Myopia, bilateral: Secondary | ICD-10-CM | POA: Diagnosis not present

## 2022-12-07 DIAGNOSIS — H25013 Cortical age-related cataract, bilateral: Secondary | ICD-10-CM | POA: Diagnosis not present

## 2022-12-07 DIAGNOSIS — H2513 Age-related nuclear cataract, bilateral: Secondary | ICD-10-CM | POA: Diagnosis not present

## 2022-12-07 DIAGNOSIS — H52203 Unspecified astigmatism, bilateral: Secondary | ICD-10-CM | POA: Diagnosis not present

## 2022-12-23 DIAGNOSIS — Z89421 Acquired absence of other right toe(s): Secondary | ICD-10-CM | POA: Diagnosis not present

## 2022-12-23 DIAGNOSIS — Z23 Encounter for immunization: Secondary | ICD-10-CM | POA: Diagnosis not present

## 2022-12-23 DIAGNOSIS — Z0000 Encounter for general adult medical examination without abnormal findings: Secondary | ICD-10-CM | POA: Diagnosis not present

## 2022-12-23 DIAGNOSIS — I10 Essential (primary) hypertension: Secondary | ICD-10-CM | POA: Diagnosis not present

## 2022-12-23 DIAGNOSIS — E782 Mixed hyperlipidemia: Secondary | ICD-10-CM | POA: Diagnosis not present

## 2022-12-23 DIAGNOSIS — R7303 Prediabetes: Secondary | ICD-10-CM | POA: Diagnosis not present

## 2023-02-19 DIAGNOSIS — Z23 Encounter for immunization: Secondary | ICD-10-CM | POA: Diagnosis not present

## 2023-07-31 DIAGNOSIS — M869 Osteomyelitis, unspecified: Secondary | ICD-10-CM

## 2023-07-31 HISTORY — DX: Osteomyelitis, unspecified: M86.9

## 2023-08-19 ENCOUNTER — Other Ambulatory Visit: Payer: Self-pay

## 2023-08-19 ENCOUNTER — Observation Stay (HOSPITAL_COMMUNITY)
Admission: EM | Admit: 2023-08-19 | Discharge: 2023-08-20 | Disposition: A | Discharge status: Home / Self Care | Attending: Internal Medicine | Admitting: Internal Medicine

## 2023-08-19 ENCOUNTER — Encounter (HOSPITAL_COMMUNITY): Payer: Self-pay

## 2023-08-19 ENCOUNTER — Emergency Department (HOSPITAL_COMMUNITY)

## 2023-08-19 DIAGNOSIS — L03032 Cellulitis of left toe: Secondary | ICD-10-CM | POA: Diagnosis not present

## 2023-08-19 DIAGNOSIS — I10 Essential (primary) hypertension: Secondary | ICD-10-CM | POA: Diagnosis not present

## 2023-08-19 DIAGNOSIS — M869 Osteomyelitis, unspecified: Principal | ICD-10-CM | POA: Diagnosis present

## 2023-08-19 DIAGNOSIS — G629 Polyneuropathy, unspecified: Secondary | ICD-10-CM | POA: Insufficient documentation

## 2023-08-19 DIAGNOSIS — M79675 Pain in left toe(s): Secondary | ICD-10-CM | POA: Diagnosis present

## 2023-08-19 DIAGNOSIS — Z79899 Other long term (current) drug therapy: Secondary | ICD-10-CM | POA: Diagnosis not present

## 2023-08-19 DIAGNOSIS — M86172 Other acute osteomyelitis, left ankle and foot: Secondary | ICD-10-CM | POA: Diagnosis not present

## 2023-08-19 DIAGNOSIS — E1169 Type 2 diabetes mellitus with other specified complication: Secondary | ICD-10-CM | POA: Diagnosis not present

## 2023-08-19 DIAGNOSIS — M7989 Other specified soft tissue disorders: Secondary | ICD-10-CM | POA: Diagnosis not present

## 2023-08-19 DIAGNOSIS — S98311D Complete traumatic amputation of right midfoot, subsequent encounter: Secondary | ICD-10-CM | POA: Diagnosis not present

## 2023-08-19 DIAGNOSIS — Z8546 Personal history of malignant neoplasm of prostate: Secondary | ICD-10-CM | POA: Diagnosis not present

## 2023-08-19 LAB — CBC WITH DIFFERENTIAL/PLATELET
Abs Immature Granulocytes: 0.07 10*3/uL (ref 0.00–0.07)
Basophils Absolute: 0.1 10*3/uL (ref 0.0–0.1)
Basophils Relative: 1 %
Eosinophils Absolute: 0.3 10*3/uL (ref 0.0–0.5)
Eosinophils Relative: 3 %
HCT: 43.9 % (ref 39.0–52.0)
Hemoglobin: 14.6 g/dL (ref 13.0–17.0)
Immature Granulocytes: 1 %
Lymphocytes Relative: 18 %
Lymphs Abs: 2.2 10*3/uL (ref 0.7–4.0)
MCH: 30.5 pg (ref 26.0–34.0)
MCHC: 33.3 g/dL (ref 30.0–36.0)
MCV: 91.6 fL (ref 80.0–100.0)
Monocytes Absolute: 1.1 10*3/uL — ABNORMAL HIGH (ref 0.1–1.0)
Monocytes Relative: 9 %
Neutro Abs: 8.7 10*3/uL — ABNORMAL HIGH (ref 1.7–7.7)
Neutrophils Relative %: 68 %
Platelets: 228 10*3/uL (ref 150–400)
RBC: 4.79 MIL/uL (ref 4.22–5.81)
RDW: 12.5 % (ref 11.5–15.5)
WBC: 12.5 10*3/uL — ABNORMAL HIGH (ref 4.0–10.5)
nRBC: 0 % (ref 0.0–0.2)

## 2023-08-19 LAB — BASIC METABOLIC PANEL
Anion gap: 12 (ref 5–15)
BUN: 25 mg/dL — ABNORMAL HIGH (ref 8–23)
CO2: 24 mmol/L (ref 22–32)
Calcium: 9.2 mg/dL (ref 8.9–10.3)
Chloride: 102 mmol/L (ref 98–111)
Creatinine, Ser: 0.83 mg/dL (ref 0.61–1.24)
GFR, Estimated: >60 mL/min (ref >60)
Glucose, Bld: 108 mg/dL — ABNORMAL HIGH (ref 70–99)
Potassium: 3.9 mmol/L (ref 3.5–5.1)
Sodium: 138 mmol/L (ref 135–145)

## 2023-08-19 LAB — HEPATIC FUNCTION PANEL
ALT: 14 U/L (ref 0–44)
AST: 23 U/L (ref 15–41)
Albumin: 3.7 g/dL (ref 3.5–5.0)
Alkaline Phosphatase: 59 U/L (ref 38–126)
Bilirubin, Direct: 0.2 mg/dL (ref 0.0–0.2)
Indirect Bilirubin: 0.7 mg/dL (ref 0.3–0.9)
Total Bilirubin: 0.9 mg/dL (ref 0.0–1.2)
Total Protein: 6.8 g/dL (ref 6.5–8.1)

## 2023-08-19 LAB — I-STAT CG4 LACTIC ACID, ED: Lactic Acid, Venous: 1.1 mmol/L (ref 0.5–1.9)

## 2023-08-19 MED ORDER — SODIUM CHLORIDE 0.9% FLUSH
3.0000 mL | Freq: Two times a day (BID) | INTRAVENOUS | Status: DC
  Administered 2023-08-19 – 2023-08-20 (×2): 3 mL via INTRAVENOUS

## 2023-08-19 MED ORDER — ACETAMINOPHEN 325 MG PO TABS
650.0000 mg | ORAL_TABLET | Freq: Four times a day (QID) | ORAL | Status: DC | PRN
  Filled 2023-08-19: qty 2

## 2023-08-19 MED ORDER — ENOXAPARIN SODIUM 40 MG/0.4ML IJ SOSY
40.0000 mg | PREFILLED_SYRINGE | INTRAMUSCULAR | Status: DC
  Administered 2023-08-20: 40 mg via SUBCUTANEOUS
  Filled 2023-08-19: qty 0.4

## 2023-08-19 MED ORDER — VANCOMYCIN HCL IN DEXTROSE 1-5 GM/200ML-% IV SOLN
1000.0000 mg | Freq: Once | INTRAVENOUS | Status: AC
  Administered 2023-08-19: 1000 mg via INTRAVENOUS
  Filled 2023-08-19: qty 200

## 2023-08-19 MED ORDER — SODIUM CHLORIDE 0.9 % IV SOLN
3.0000 g | Freq: Once | INTRAVENOUS | Status: AC
  Administered 2023-08-19: 3 g via INTRAVENOUS
  Filled 2023-08-19: qty 8

## 2023-08-19 MED ORDER — SODIUM CHLORIDE 0.9 % IV SOLN
2.0000 g | INTRAVENOUS | Status: DC
  Administered 2023-08-19: 2 g via INTRAVENOUS
  Filled 2023-08-19: qty 20

## 2023-08-19 MED ORDER — POLYETHYLENE GLYCOL 3350 17 G PO PACK: 17.0000 g | PACK | Freq: Every day | ORAL | Status: DC | PRN

## 2023-08-19 MED ORDER — VANCOMYCIN HCL 1750 MG/350ML IV SOLN
1750.0000 mg | INTRAVENOUS | Status: DC
  Administered 2023-08-19: 1750 mg via INTRAVENOUS
  Filled 2023-08-19 (×2): qty 350

## 2023-08-19 MED ORDER — ACETAMINOPHEN 650 MG RE SUPP: 650.0000 mg | Freq: Four times a day (QID) | RECTAL | Status: DC | PRN

## 2023-08-19 MED ORDER — METRONIDAZOLE 500 MG/100ML IV SOLN
500.0000 mg | Freq: Two times a day (BID) | INTRAVENOUS | Status: DC
  Administered 2023-08-20 (×2): 500 mg via INTRAVENOUS
  Filled 2023-08-19 (×2): qty 100

## 2023-08-19 NOTE — Assessment & Plan Note (Addendum)
 Left second toe . Given the acuity of presentation, I am not 100% sure if this represents true bone infection. There is no associated chronic ulcer of toe.  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  S.p vanco+unasun. Mild luekocytossis. C.w. unasyn. I will engage Dr. Lajoyce Corners service. Check mri foot.

## 2023-08-19 NOTE — H&P (Signed)
 History and Physical    Patient: Theodore Dominguez DGU:[id number] DOB: February 19, 1941 DOA: 08/19/2023 DOS: the patient was seen and examined on 08/19/2023 PCP: [facility]  Patient coming from:  sent by   Chief Complaint:  Chief Complaint  Patient presents with   Toe Pain   HPI: Theodore Dominguez is a 83 y.o. male with medical history significant of prediabetes and prior toe amputations due to osteomyleitis.  Pt reports Sunday the left second toe started swelling and had some clear drainage from it. Reports it got worse over the past few days. However curently no discharge. Patient was seen by PCP and sent to Er. No spreding erythema or rigors or fever. Patient eating drinking ok. No diahrea.  ER: [facility]SS. Xray foot : Osteomyelitis of the 2nd middle and distal phalanges, on both sides of the 2nd DIP joint. 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  Patient s.p. unasyn. And vancomycin.  Review of Systems: As mentioned in the history of present illness. All other systems reviewed and are negative. Past Medical History:  Diagnosis Date   Cancer [facility])    Cataract of both eyes    immature   Hypertension    Prostate cancer (HCC)    2002   Past Surgical History:  Procedure Laterality Date   AMPUTATION Right 03/23/2014   Procedure: 1st Ray Amputation Right Foot;  Surgeon: Nadara Mustard, MD;  Location[facility] OR;  Service: Orthopedics;  Laterality: Right;   AMPUTATION Right 12/21/2014   Procedure: Right Transmetatarsal Amputation;  Surgeon: Nadara Mustard, MD;  Location[facility] OR;  Service: Orthopedics;  Laterality: Right;   COLONOSCOPY     cyst on head     back of head/ 2011   POLYPECTOMY     PROSTATE SURGERY     Social History:  reports that he has never smoked. He has never used smokeless tobacco. He reports current alcohol use. He reports that he does not use drugs.  No Known Allergies  Family History  Problem Relation Age of Onset    Colon cancer Neg Hx     Prior to Admission medications   Medication Sig Start Date End Date Taking? Authorizing Provider  calcium-vitamin D (OSCAL WITH D) 500-200 MG-UNIT per tablet Take 1 tablet by mouth daily.   Yes [provider]  fish oil-omega-3 fatty acids 1000 MG capsule Take 1 g by mouth daily.    Yes [provider]  lisinopril-hydrochlorothiazide (PRINZIDE,ZESTORETIC) 20-25 MG per tablet Take 1 tablet by mouth every morning.    Yes [provider]  metoprolol succinate (TOPROL-XL) 50 MG 24 hr tablet Take 50 mg by mouth daily. Take with or immediately following a meal.   Yes [provider]  Multiple Vitamin (MULTIVITAMIN) tablet Take 1 tablet by mouth daily. Senior MVI-Take one daily   Yes [provider]  vitamin E 400 UNIT capsule Take 400 Units by mouth daily.   Yes [provider]  Ascorbic Acid (VITAMIN C) 1000 MG tablet Take 1,000 mg by mouth daily.  Patient not taking: Reported on 08/19/2023    [provider]  doxycycline (VIBRAMYCIN) 100 MG capsule Take 100 mg by mouth 2 (two) times daily. For 15 days Patient not taking: Reported on 08/19/2023 03/21/14   [provider]    Physical Exam: Vitals:   08/19/23 1712 08/19/23 1929  BP: (!) 171/79 (!) 145/77  Pulse: 70 72  Resp: 18  18  Temp: 97.7 F (36.5 C) 98 F (36.7 C)  TempSrc: Oral   SpO2: 97% 98%  Weight: 83.9 kg   Height: 5\' 9"  (1.753 m)    General: Patient is alert awake oriented x 3 appears to be in no distress Respiratory exam: Bilateral intravesicular Cardiovascular exam S1-S2 normal Abdomen all quadrants are soft nontender Extremities warm without edema. Left second toe appears swollen.  No clear discharge.  Toe is erythematous.  No foul smell or gangrene.   Media Information  Document Information  Photos    08/19/2023 17:57  Attached To:  Hospital Encounter on 08/19/23  Source Information  Benjiman Core, MD   [facility]  Document History     Media Information  Document Information  Photos    08/19/2023 17:57  Attached To:  Hospital Encounter on 08/19/23  Source Information  Benjiman Core, MD  [facility]  Document History    Data Reviewed:  Labs on Admission:  Results for orders placed or performed during the hospital encounter of 08/19/23 (from the past 24 hours)  CBC with Differential     Status: Abnormal   Collection Time: 08/19/23  6:28 PM  Result Value Ref Range   WBC 12.5 (H) 4.0 - 10.5 K/uL   RBC 4.79 4.22 - 5.81 MIL/uL   Hemoglobin 14.6 13.0 - 17.0 g/dL   HCT 19.1 47.8 - 29.5 %   MCV 91.6 80.0 - 100.0 fL   MCH 30.5 26.0 - 34.0 pg   MCHC 33.3 30.0 - 36.0 g/dL   RDW 62.1 30.8 - 65.7 %   Platelets 228 150 - 400 K/uL   nRBC 0.0 0.0 - 0.2 %   Neutrophils Relative % 68 %   Neutro Abs 8.7 (H) 1.7 - 7.7 K/uL   Lymphocytes Relative 18 %   Lymphs Abs 2.2 0.7 - 4.0 K/uL   Monocytes Relative 9 %   Monocytes Absolute 1.1 (H) 0.1 - 1.0 K/uL   Eosinophils Relative 3 %   Eosinophils Absolute 0.3 0.0 - 0.5 K/uL   Basophils Relative 1 %   Basophils Absolute 0.1 0.0 - 0.1 K/uL   Immature Granulocytes 1 %   Abs Immature Granulocytes 0.07 0.00 - 0.07 K/uL  Basic metabolic panel     Status: Abnormal   Collection Time: 08/19/23  6:28 PM  Result Value Ref Range   Sodium 138 135 - 145 mmol/L   Potassium 3.9 3.5 - 5.1 mmol/L   Chloride 102 98 - 111 mmol/L   CO2 24 22 - 32 mmol/L   Glucose, Bld 108 (H) 70 - 99 mg/dL   BUN 25 (H) 8 - 23 mg/dL   Creatinine, Ser 8.46 0.61 - 1.24 mg/dL   Calcium 9.2 8.9 - 96.2 mg/dL   GFR, Estimated >95 >28 mL/min   Anion gap 12 5 - 15  Hepatic function panel     Status: None   Collection Time: 08/19/23  7:02 PM  Result Value Ref Range   Total Protein 6.8 6.5 - 8.1 g/dL   Albumin 3.7 3.5 - 5.0 g/dL   AST 23 15 - 41 U/L   ALT 14 0 - 44 U/L   Alkaline Phosphatase 59 38 - 126 U/L   Total Bilirubin 0.9 0.0 - 1.2 mg/dL   Bilirubin,  Direct 0.2 0.0 - 0.2 mg/dL   Indirect Bilirubin 0.7 0.3 - 0.9 mg/dL  I-Stat Lactic Acid, ED     Status: None   Collection Time: 08/19/23  7:14 PM  Result Value Ref Range   Lactic Acid, Venous 1.1 0.5 - 1.9 mmol/L   Basic Metabolic Panel: Recent Labs  Lab 08/19/23 1828  NA 138  K 3.9  CL 102  CO2 24  GLUCOSE 108*  BUN 25*  CREATININE 0.83  CALCIUM 9.2   Liver Function Tests: Recent Labs  Lab 08/19/23 1902  AST 23  ALT 14  ALKPHOS 59  BILITOT 0.9  PROT 6.8  ALBUMIN 3.7   No results for input(s): "LIPASE", "AMYLASE" in the last 168 hours. No results for input(s): "AMMONIA" in the last 168 hours. CBC: Recent Labs  Lab 08/19/23 1828  WBC 12.5*  NEUTROABS 8.7*  HGB 14.6  HCT 43.9  MCV 91.6  PLT 228   Cardiac Enzymes: No results for input(s): "CKTOTAL", "CKMB", "CKMBINDEX", "TROPONINIHS" in the last 168 hours.  BNP (last 3 results) No results for input(s): "PROBNP" in the last 8760 hours. CBG: No results for input(s): "GLUCAP" in the last 168 hours.  Radiological Exams on Admission:  DG Toe 2nd Left Result Date: 08/19/2023 CLINICAL DATA:  Left 2nd toe infection and drainage. Clinical concern for osteomyelitis. EXAM: LEFT SECOND TOE COMPARISON:  None Available. FINDINGS: Diffuse soft tissue swelling involving the 2nd toe. Small area of bone destruction involving the dorsomedial aspect of the base of the 2nd distal phalanx and moderate-sized area of bone destruction involving the dorsomedial aspect of the distal 2nd middle phalanx. No soft tissue calcifications or gas seen. 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 Electronically Signed   By: Beckie Salts M.D.   On: 08/19/2023 18:33    chest X-ray   No intake/output data recorded. No intake/output data recorded.        Assessment and Plan: * Osteomyelitis  (HCC) Left second toe . Given the acuity of presentation, I am not 100% sure if this represents true bone infection. There is no associated chronic ulcer of toe.  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  S.p vanco+unasun. Mild luekocytossis. C.w. unasyn. I will engage Dr. Lajoyce Corners service. Check mri foot.  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 Check b12, thiamine   DVT PPX - lovenox.   Advance Care Planning:   Code Status: Prior full cdoe.  Consults: I have sent a message to Dr. Lajoyce Corners - please folow up in AM  Family Communication: per pateint.  Severity of Illness: The appropriate patient status for this patient is INPATIENT. Inpatient status is judged to be reasonable and necessary in order to provide the required intensity of service to ensure the patient's safety. The patient's presenting symptoms, physical exam findings, and initial radiographic and laboratory data in the context of their chronic comorbidities is felt to place them at high risk for further clinical deterioration. Furthermore, it is not anticipated that the patient will be medically stable for discharge from the hospital within 2 midnights of admission.   * I certify that at the point of admission it is my clinical judgment that the patient will require inpatient hospital care spanning beyond 2 midnights from the point of admission due to high intensity of service, high risk for further deterioration and high frequency of surveillance required.*  Author: Nolberto Hanlon, MD 08/19/2023 8:48 PM  For on call review [email or web address].

## 2023-08-19 NOTE — ED Notes (Signed)
 ED TO INPATIENT HANDOFF REPORT  ED Nurse Name and Phone #: Leanord Hawking, RN  S Name/Age/Gender Theodore Dominguez 83 y.o. male Room/Bed: WA14/WA14  Code Status   Code Status: Full Code  Home/SNF/Other Home Patient oriented to: self, place, time, and situation Is this baseline? Yes   Triage Complete: Triage complete  Chief Complaint Osteomyelitis [facility]) [M86.9]  Triage Note Patient sent over from [facility] Physician's to rule out possible osteomyelitis. Pt reports Sunday the left second toe started swelling and had some clear drainage from it. Reports it got worse over the past few days. 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    Allergies No Known Allergies  Level of Care/Admitting Diagnosis ED Disposition     ED Disposition  Admit   Condition  --   Comment  Hospital Area: [facility] [[id number]]  Level of Care: Med-Surg [16]  May place patient in observation at [facility] or [facility] if equivalent level of care is available:: No  Covid Evaluation: Asymptomatic - no recent exposure (last 10 days) testing not required  Diagnosis: Osteomyelitis [facility]) [[id number]]  Admitting Physician: Nolberto Hanlon [[id number]]  Attending Physician: Nolberto Hanlon [[id number]]          B Medical/Surgery History Past Medical History:  Diagnosis Date   Cancer (HCC)    Cataract of both eyes    immature   Hypertension    Prostate cancer (HCC)    2002   Past Surgical History:  Procedure Laterality Date   AMPUTATION Right 03/23/2014   Procedure: 1st Ray Amputation Right Foot;  Surgeon: Nadara Mustard, MD;  Location: [facility] OR;  Service: Orthopedics;  Laterality: Right;   AMPUTATION Right 12/21/2014   Procedure: Right Transmetatarsal Amputation;  Surgeon: Nadara Mustard, MD;  Location: [facility] OR;  Service: Orthopedics;  Laterality: Right;   COLONOSCOPY     cyst on head     back of head/ 2011   POLYPECTOMY     PROSTATE SURGERY        A IV Location/Drains/Wounds Patient Lines/Drains/Airways Status     Active Line/Drains/Airways     Name Placement date Placement time Site Days   Peripheral IV 08/19/23 20 G Anterior;Distal;Right;Upper Arm 08/19/23  1830  Arm  less than 1   Incision (Closed) 12/21/14 Foot 12/21/14  1822  -- 3163            Intake/Output Last 24 hours No intake or output data in the 24 hours ending 08/19/23 2111  Labs/Imaging Results for orders placed or performed during the hospital encounter of 08/19/23 (from the past 48 hours)  CBC with Differential     Status: Abnormal   Collection Time: 08/19/23  6:28 PM  Result Value Ref Range   WBC 12.5 (H) 4.0 - 10.5 K/uL   RBC 4.79 4.22 - 5.81 MIL/uL   Hemoglobin 14.6 13.0 - 17.0 g/dL   HCT 29.5 62.1 - 30.8 %   MCV 91.6 80.0 - 100.0 fL   MCH 30.5 26.0 - 34.0 pg   MCHC 33.3 30.0 - 36.0 g/dL   RDW 65.7 84.6 - 96.2 %   Platelets 228 150 - 400 K/uL   nRBC 0.0 0.0 - 0.2 %   Neutrophils Relative % 68 %   Neutro Abs 8.7 (H) 1.7 - 7.7 K/uL   Lymphocytes Relative 18 %   Lymphs Abs 2.2 0.7 - 4.0 K/uL   Monocytes Relative  9 %   Monocytes Absolute 1.1 (H) 0.1 - 1.0 K/uL   Eosinophils Relative 3 %   Eosinophils Absolute 0.3 0.0 - 0.5 K/uL   Basophils Relative 1 %   Basophils Absolute 0.1 0.0 - 0.1 K/uL   Immature Granulocytes 1 %   Abs Immature Granulocytes 0.07 0.00 - 0.07 K/uL    Comment: Performed at [facility], [address] [place], [place] [address]  Basic metabolic panel     Status: Abnormal   Collection Time: 08/19/23  6:28 PM  Result Value Ref Range   Sodium 138 135 - 145 mmol/L   Potassium 3.9 3.5 - 5.1 mmol/L   Chloride 102 98 - 111 mmol/L   CO2 24 22 - 32 mmol/L   Glucose, Bld 108 (H) 70 - 99 mg/dL    Comment: Glucose reference range applies only to samples taken after fasting for at least 8 hours.   BUN 25 (H) 8 - 23 mg/dL   Creatinine, Ser 6.04 0.61 - 1.24 mg/dL   Calcium 9.2 8.9 - 54.0 mg/dL   GFR,  Estimated >98 >11 mL/min    Comment: (NOTE) Calculated using the CKD-EPI Creatinine Equation (2021)    Anion gap 12 5 - 15    Comment: Performed at [facility], [address], [place], [place] [address]  Hepatic function panel     Status: None   Collection Time: 08/19/23  7:02 PM  Result Value Ref Range   Total Protein 6.8 6.5 - 8.1 g/dL   Albumin 3.7 3.5 - 5.0 g/dL   AST 23 15 - 41 U/L   ALT 14 0 - 44 U/L   Alkaline Phosphatase 59 38 - 126 U/L   Total Bilirubin 0.9 0.0 - 1.2 mg/dL   Bilirubin, Direct 0.2 0.0 - 0.2 mg/dL   Indirect Bilirubin 0.7 0.3 - 0.9 mg/dL    Comment: Performed at [facility], [address], [place], [place] [address]  I-Stat Lactic Acid, ED     Status: None   Collection Time: 08/19/23  7:14 PM  Result Value Ref Range   Lactic Acid, Venous 1.1 0.5 - 1.9 mmol/L   DG Toe 2nd Left Result Date: 08/19/2023 CLINICAL DATA:  Left 2nd toe infection and drainage. Clinical concern for osteomyelitis. EXAM: LEFT SECOND TOE COMPARISON:  None Available. FINDINGS: Diffuse soft tissue swelling involving the 2nd toe. Small area of bone destruction involving the dorsomedial aspect of the base of the 2nd distal phalanx and moderate-sized area of bone destruction involving the dorsomedial aspect of the distal 2nd middle phalanx. No soft tissue calcifications or gas seen. 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 Electronically Signed   By: Beckie Salts M.D.   On: 08/19/2023 18:33    Pending Labs Unresulted Labs (From admission, onward)     Start     Ordered   08/26/23 0500  Creatinine, serum  (enoxaparin (LOVENOX)    CrCl >/= 30 ml/min)  Weekly,   R     Comments: while on enoxaparin therapy    08/19/23 2048   08/20/23 0500  APTT  Tomorrow morning,   R        08/19/23 2048   08/20/23 0500   Protime-INR  Tomorrow morning,   R        08/19/23 2048  08/20/23 0500  Basic metabolic panel  Tomorrow morning,   R        08/19/23 2048   08/20/23 0500  CBC  Tomorrow morning,   R        08/19/23 2048   08/20/23 0500  Vitamin B12  Tomorrow morning,   R        08/19/23 2103   08/20/23 0500  Vitamin B1  Tomorrow morning,   R        08/19/23 2103   08/19/23 1846  Blood Cultures x 2 sites  BLOOD CULTURE X 2,   STAT      08/19/23 1846            Vitals/Pain Today's Vitals   08/19/23 1712 08/19/23 1929 08/19/23 2100  BP: (!) 171/79 (!) 145/77 136/72  Pulse: 70 72 77  Resp: 18 18 18   Temp: 97.7 F (36.5 C) 98 F (36.7 C) 98.2 F (36.8 C)  TempSrc: Oral  Oral  SpO2: 97% 98% 98%  Weight: 83.9 kg    Height: 5\' 9"  (1.753 m)    PainSc: 0-No pain      Isolation Precautions No active isolations  Medications Medications  enoxaparin (LOVENOX) injection 40 mg (has no administration in time range)  acetaminophen (TYLENOL) tablet 650 mg (has no administration in time range)    Or  acetaminophen (TYLENOL) suppository 650 mg (has no administration in time range)  polyethylene glycol (MIRALAX / GLYCOLAX) packet 17 g (has no administration in time range)  sodium chloride flush (NS) 0.9 % injection 3 mL (3 mLs Intravenous Given 08/19/23 2101)  Ampicillin-Sulbactam (UNASYN) 3 g in sodium chloride 0.9 % 100 mL IVPB (0 g Intravenous Stopped 08/19/23 1929)    And  vancomycin (VANCOCIN) IVPB 1000 mg/200 mL premix (0 mg Intravenous Stopped 08/19/23 2027)    Mobility walks     Focused Assessments Skin assessment - notable erythema and swelling noted to the L 2nd toe.    R Recommendations: See Admitting Provider Note  Report given to:   Additional Notes: N/A

## 2023-08-19 NOTE — Assessment & Plan Note (Signed)
 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 Check b12, thiamine

## 2023-08-19 NOTE — Progress Notes (Signed)
 Pharmacy Antibiotic Note  Theodore Dominguez is a 83 y.o. male admitted on 08/19/2023 with  left second toe osteomyelitis .  Pharmacy has been consulted for Zosyn and vancomycin dosing.  Contacted provider who agreed to change Zosyn to Rocephin + Flagyl due to penicillin allergy.    Plan: Rocephin 2 g IV q24h + Flagyl 500 mg IV q12h per provider Vancomycin 1750 mg IV q24h (Goal AUC 400-550, eAUC 487.8, SCr used: 0.83) Monitor clinical progress, renal function, vancomycin levels as indicated F/U C&S, abx deescalation / LOT   Height: 5\' 9"  (175.3 cm) Weight: 83.9 kg (184 lb 15.5 oz) IBW/kg (Calculated) : 70.7  Temp (24hrs), Avg:98 F (36.7 C), Min:97.7 F (36.5 C), Max:98.2 F (36.8 C)  Recent Labs  Lab 08/19/23 1828 08/19/23 1914  WBC 12.5*  --   CREATININE 0.83  --   LATICACIDVEN  --  1.1    Estimated Creatinine Clearance: 66.3 mL/min (by C-G formula based on SCr of 0.83 mg/dL).    No Known Allergies  Antimicrobials this admission: Rocephin 3/20 >>  Flagyl 3/20 >>  Vancomycin 3/20 >>   Microbiology results: 3/20 BCx: sent   Thank you for allowing pharmacy to be a part of this patient's care.  Selinda Eon, PharmD, BCPS Clinical Pharmacist [place] 08/19/2023 9:42 PM

## 2023-08-19 NOTE — ED Provider Notes (Signed)
 [place] EMERGENCY DEPARTMENT AT [facility] Provider Note   CSN: [id number] Arrival date & time: 08/19/23  1704     History  Chief Complaint  Patient presents with   Toe Pain    Theodore Dominguez is a 83 y.o. male.   Toe Pain  Patient with right second toe pain.  Is had for a few days.  Had some clear drainage and been doing topical ointments.  Has had previous amputations of contralateral toes.  No fevers.  Reportedly is "prediabetic".     Home Medications Prior to Admission medications   Medication Sig Start Date End Date Taking? Authorizing Provider  Ascorbic Acid (VITAMIN C) 1000 MG tablet Take 1,000 mg by mouth daily.     [provider]  aspirin 81 MG tablet Take 81 mg by mouth daily.    [provider]  aspirin EC 325 MG tablet Take 1 tablet (325 mg total) by mouth daily. Patient not taking: Reported on 12/21/2014 03/24/14   Nadara Mustard, MD  calcium-vitamin D (OSCAL WITH D) 500-200 MG-UNIT per tablet Take 1 tablet by mouth daily.    [provider]  doxycycline (VIBRAMYCIN) 100 MG capsule Take 100 mg by mouth 2 (two) times daily. For 15 days 03/21/14   [provider]  fish oil-omega-3 fatty acids 1000 MG capsule Take 1 g by mouth daily.     [provider]  HYDROcodone-acetaminophen (NORCO) 5-325 MG per tablet Take 1 tablet by mouth every 6 (six) hours as needed. 03/24/14   Nadara Mustard, MD  lisinopril-hydrochlorothiazide (PRINZIDE,ZESTORETIC) 20-25 MG per tablet Take 1 tablet by mouth every morning.     [provider]  metoprolol succinate (TOPROL-XL) 50 MG 24 hr tablet Take 50 mg by mouth daily. Take with or immediately following a meal.    [provider]  Multiple Vitamin (MULTIVITAMIN) tablet Take 1 tablet by mouth daily. Senior MVI-Take one daily    [provider]  vitamin E 400 UNIT capsule Take 400 Units by mouth daily.    [provider]      Allergies    Patient  has no known allergies.    Review of Systems   Review of Systems  Physical Exam Updated Vital Signs BP (!) 145/77 (BP Location: Left Arm)   Pulse 72   Temp 98 F (36.7 C)   Resp 18   Ht 5\' 9"  (1.753 m)   Wt 83.9 kg   SpO2 98%   BMI 27.31 kg/m  Physical Exam Vitals reviewed.  Cardiovascular:     Rate and Rhythm: Normal rate.  Musculoskeletal:     Comments: Mild swelling of second toe on left foot.  Slight erythema.  No drainage.  No erythema of the foot.  Neurological:     Mental Status: He is alert.        ED Results / Procedures / Treatments   Labs (all labs ordered are listed, but only abnormal results are displayed) Labs Reviewed  CBC WITH DIFFERENTIAL/PLATELET - Abnormal; Notable for the following components:      Result Value   WBC 12.5 (*)    Neutro Abs 8.7 (*)    Monocytes Absolute 1.1 (*)    All other components within normal limits  BASIC METABOLIC PANEL - Abnormal; Notable for the following components:   Glucose, Bld 108 (*)    BUN 25 (*)    All other components within normal limits  CULTURE, BLOOD (ROUTINE X 2)  CULTURE, BLOOD (ROUTINE X 2)  HEPATIC FUNCTION PANEL  I-STAT CG4 LACTIC ACID, ED    EKG None  Radiology DG Toe 2nd Left Result Date: 08/19/2023 CLINICAL DATA:  Left 2nd toe infection and drainage. Clinical concern for osteomyelitis. EXAM: LEFT SECOND TOE COMPARISON:  None Available. FINDINGS: Diffuse soft tissue swelling involving the 2nd toe. Small area of bone destruction involving the dorsomedial aspect of the base of the 2nd distal phalanx and moderate-sized area of bone destruction involving the dorsomedial aspect of the distal 2nd middle phalanx. No soft tissue calcifications or gas seen. 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 Electronically Signed   By: Beckie Salts M.D.   On:  08/19/2023 18:33    Procedures Procedures    Medications Ordered in ED Medications  Ampicillin-Sulbactam (UNASYN) 3 g in sodium chloride 0.9 % 100 mL IVPB (0 g Intravenous Stopped 08/19/23 1929)    And  vancomycin (VANCOCIN) IVPB 1000 mg/200 mL premix (1,000 mg Intravenous New Bag/Given 08/19/23 1927)    ED Course/ Medical Decision Making/ A&P                                 Medical Decision Making Amount and/or Complexity of Data Reviewed Labs: ordered. Radiology: ordered.  Risk Prescription drug management. Decision regarding hospitalization.   Patient with some pain and swelling on left second toe.  Differential diagnosis does include infection.  Will get blood work and x-ray.  Reviewed paperwork that came with patient from PCP.  X-ray shows osteomyelitis.  White count mildly elevated.  With osteomyelitis will discuss with hospitalist for admission.  Does not appear septic at this time.        Final Clinical Impression(s) / ED Diagnoses Final diagnoses:  Osteomyelitis of second toe of left foot [facility])    Rx / DC Orders ED Discharge Orders     None         Benjiman Core, MD 08/19/23 1943

## 2023-08-19 NOTE — ED Triage Notes (Signed)
 Patient sent over from [facility] Physician's to rule out possible osteomyelitis. Pt reports Sunday the left second toe started swelling and had some clear drainage from it. Reports it got worse over the past few days. 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

## 2023-08-20 ENCOUNTER — Observation Stay (HOSPITAL_COMMUNITY)

## 2023-08-20 DIAGNOSIS — M86172 Other acute osteomyelitis, left ankle and foot: Secondary | ICD-10-CM | POA: Diagnosis not present

## 2023-08-20 DIAGNOSIS — M2012 Hallux valgus (acquired), left foot: Secondary | ICD-10-CM | POA: Diagnosis not present

## 2023-08-20 DIAGNOSIS — M869 Osteomyelitis, unspecified: Secondary | ICD-10-CM | POA: Diagnosis not present

## 2023-08-20 DIAGNOSIS — M19072 Primary osteoarthritis, left ankle and foot: Secondary | ICD-10-CM | POA: Diagnosis not present

## 2023-08-20 DIAGNOSIS — M7989 Other specified soft tissue disorders: Secondary | ICD-10-CM | POA: Diagnosis not present

## 2023-08-20 LAB — BASIC METABOLIC PANEL
Anion gap: 9 (ref 5–15)
BUN: 19 mg/dL (ref 8–23)
CO2: 25 mmol/L (ref 22–32)
Calcium: 8.6 mg/dL — ABNORMAL LOW (ref 8.9–10.3)
Chloride: 104 mmol/L (ref 98–111)
Creatinine, Ser: 0.76 mg/dL (ref 0.61–1.24)
GFR, Estimated: >60 mL/min (ref >60)
Glucose, Bld: 95 mg/dL (ref 70–99)
Potassium: 3.7 mmol/L (ref 3.5–5.1)
Sodium: 138 mmol/L (ref 135–145)

## 2023-08-20 LAB — PROTIME-INR
INR: 1.1 (ref 0.8–1.2)
Prothrombin Time: 14 s (ref 11.4–15.2)

## 2023-08-20 LAB — CBC
HCT: 39.5 % (ref 39.0–52.0)
Hemoglobin: 13.4 g/dL (ref 13.0–17.0)
MCH: 30.9 pg (ref 26.0–34.0)
MCHC: 33.9 g/dL (ref 30.0–36.0)
MCV: 91.2 fL (ref 80.0–100.0)
Platelets: 241 10*3/uL (ref 150–400)
RBC: 4.33 MIL/uL (ref 4.22–5.81)
RDW: 12.5 % (ref 11.5–15.5)
WBC: 11.2 10*3/uL — ABNORMAL HIGH (ref 4.0–10.5)
nRBC: 0 % (ref 0.0–0.2)

## 2023-08-20 LAB — APTT: aPTT: 30 s (ref 24–36)

## 2023-08-20 LAB — VITAMIN B12: Vitamin B-12: 363 pg/mL (ref 180–914)

## 2023-08-20 MED ORDER — GADOBUTROL 1 MMOL/ML IV SOLN
8.0000 mL | Freq: Once | INTRAVENOUS | Status: AC | PRN
  Administered 2023-08-20: 8 mL via INTRAVENOUS

## 2023-08-20 MED ORDER — METOPROLOL SUCCINATE ER 25 MG PO TB24: 25.0000 mg | ORAL_TABLET | Freq: Every day | ORAL | Status: DC

## 2023-08-20 MED ORDER — AMOXICILLIN-POT CLAVULANATE 875-125 MG PO TABS: 1.0000 | ORAL_TABLET | Freq: Two times a day (BID) | ORAL | 0 refills | Status: DC

## 2023-08-20 MED ORDER — DOXYCYCLINE HYCLATE 100 MG PO TABS: 100.0000 mg | ORAL_TABLET | Freq: Two times a day (BID) | ORAL | 0 refills | Status: DC

## 2023-08-20 NOTE — Progress Notes (Signed)
 Mobility Specialist - Progress Note   08/20/23 0922  Mobility  Activity Ambulated independently in hallway  Level of Assistance Independent  Assistive Device None  Distance Ambulated (ft) 500 ft  Activity Response Tolerated well  Mobility Referral Yes  Mobility visit 1 Mobility  Mobility Specialist Start Time (ACUTE ONLY) 0914  Mobility Specialist Stop Time (ACUTE ONLY) [id number]  Mobility Specialist Time Calculation (min) (ACUTE ONLY) 7 min   Pt received EOB and agreeable to mobility. No complaints during session. Pt to EOB after session with all needs met.    [facility]

## 2023-08-20 NOTE — Plan of Care (Signed)

## 2023-08-20 NOTE — Care Management Obs Status (Signed)
 MEDICARE OBSERVATION STATUS NOTIFICATION   Patient Details  Name: Theodore Dominguez MRN: [id number] Date of Birth: 1941/05/31   Medicare Observation Status Notification Given:  Yes    Adrian Prows, RN 08/20/2023, 5:02 PM

## 2023-08-20 NOTE — Care Management (Signed)
 This RNCM spoke with patient to explain Code 44/OBS status. Patient verbalized  understanding.  Evening RNCM reports she will complete the code 44 delivery.

## 2023-08-20 NOTE — Plan of Care (Signed)
 Patient discharge to home independently. Discharge education done at bedside. Patient verbalized understanding of follow up appointment and antibiotic/medication regimen. Vitals stable. IV removed.  Problem: Education: Goal: Knowledge of General Education information will improve Description: Including pain rating scale, medication(s)/side effects and non-pharmacologic comfort measures 08/20/2023 1806 by Carmine Savoy, RN Outcome: Adequate for Discharge 08/20/2023 1701 by Carmine Savoy, RN Outcome: Progressing   Problem: Health Behavior/Discharge Planning: Goal: Ability to manage health-related needs will improve 08/20/2023 1806 by Carmine Savoy, RN Outcome: Adequate for Discharge 08/20/2023 1701 by Carmine Savoy, RN Outcome: Progressing   Problem: Clinical Measurements: Goal: Ability to maintain clinical measurements within normal limits will improve 08/20/2023 1806 by Carmine Savoy, RN Outcome: Adequate for Discharge 08/20/2023 1701 by Carmine Savoy, RN Outcome: Progressing Goal: Will remain free from infection 08/20/2023 1806 by Carmine Savoy, RN Outcome: Adequate for Discharge 08/20/2023 1701 by Carmine Savoy, RN Outcome: Progressing Goal: Diagnostic test results will improve 08/20/2023 1806 by Carmine Savoy, RN Outcome: Adequate for Discharge 08/20/2023 1701 by Carmine Savoy, RN Outcome: Progressing Goal: Respiratory complications will improve 08/20/2023 1806 by Carmine Savoy, RN Outcome: Adequate for Discharge 08/20/2023 1701 by Carmine Savoy, RN Outcome: Progressing Goal: Cardiovascular complication will be avoided 08/20/2023 1806 by Carmine Savoy, RN Outcome: Adequate for Discharge 08/20/2023 1701 by Carmine Savoy, RN Outcome: Progressing   Problem: Activity: Goal: Risk for activity intolerance will decrease 08/20/2023 1806 by Carmine Savoy, RN Outcome: Adequate for Discharge 08/20/2023 1701 by Carmine Savoy, RN Outcome:  Progressing   Problem: Nutrition: Goal: Adequate nutrition will be maintained 08/20/2023 1806 by Carmine Savoy, RN Outcome: Adequate for Discharge 08/20/2023 1701 by Carmine Savoy, RN Outcome: Progressing   Problem: Coping: Goal: Level of anxiety will decrease 08/20/2023 1806 by Carmine Savoy, RN Outcome: Adequate for Discharge 08/20/2023 1701 by Carmine Savoy, RN Outcome: Progressing   Problem: Elimination: Goal: Will not experience complications related to bowel motility 08/20/2023 1806 by Carmine Savoy, RN Outcome: Adequate for Discharge 08/20/2023 1701 by Carmine Savoy, RN Outcome: Progressing Goal: Will not experience complications related to urinary retention 08/20/2023 1806 by Carmine Savoy, RN Outcome: Adequate for Discharge 08/20/2023 1701 by Carmine Savoy, RN Outcome: Progressing   Problem: Pain Managment: Goal: General experience of comfort will improve and/or be controlled 08/20/2023 1806 by Carmine Savoy, RN Outcome: Adequate for Discharge 08/20/2023 1701 by Carmine Savoy, RN Outcome: Progressing   Problem: Safety: Goal: Ability to remain free from injury will improve 08/20/2023 1806 by Carmine Savoy, RN Outcome: Adequate for Discharge 08/20/2023 1701 by Carmine Savoy, RN Outcome: Progressing   Problem: Skin Integrity: Goal: Risk for impaired skin integrity will decrease 08/20/2023 1806 by Carmine Savoy, RN Outcome: Adequate for Discharge 08/20/2023 1701 by Carmine Savoy, RN Outcome: Progressing

## 2023-08-20 NOTE — Progress Notes (Signed)
   08/20/23 1540  TOC Brief Assessment  Insurance and Status Reviewed  Patient has primary care physician Yes  Home environment has been reviewed resides in private residence  Prior level of function: Independent  Prior/Current Home Services No current home services  Social Drivers of Health Review SDOH reviewed no interventions necessary  Readmission risk has been reviewed Yes  Transition of care needs no transition of care needs at this time

## 2023-08-20 NOTE — Progress Notes (Signed)
 Pharmacy Antibiotic Note  Theodore Dominguez is a 83 y.o. male admitted on 08/19/2023 with  left second toe osteomyelitis . Pharmacy has been consulted for vancomycin dosing.  Plan: -Continue vancomycin 1750 mg IV q24h -Also on ceftriaxone and metronidazole -Continue to follow renal function, cultures and clinical progress for dose adjustments and de-escalation as indicated   Height: 5\' 9"  (175.3 cm) Weight: 83.9 kg (184 lb 15.5 oz) IBW/kg (Calculated) : 70.7  Temp (24hrs), Avg:98.1 F (36.7 C), Min:97.7 F (36.5 C), Max:98.8 F (37.1 C)  Recent Labs  Lab 08/19/23 1828 08/19/23 1914 08/20/23 0430  WBC 12.5*  --  11.2*  CREATININE 0.83  --  0.76  LATICACIDVEN  --  1.1  --     Estimated Creatinine Clearance: 68.7 mL/min (by C-G formula based on SCr of 0.76 mg/dL).    No Known Allergies  Antimicrobials this admission: Rocephin 3/20 >>  Flagyl 3/20 >>  Vancomycin 3/20 >>   Microbiology results: 3/20 BCx: ngtd   Thank you for allowing pharmacy to be a part of this patient's care.  Pricilla Riffle, PharmD, BCPS Clinical Pharmacist 08/20/2023 10:02 AM

## 2023-08-20 NOTE — Progress Notes (Signed)
 PROGRESS NOTE    Theodore Dominguez  GEX:[id number] DOB: November 27, 1940 DOA: 08/19/2023 PCP: [facility]   Brief Narrative: 83 year old with past medical history significant for prediabetes and prior toe amputation due to osteomyelitis.  Reports left second toe swelling and drainage that is started 4 days prior to admission.  For over the last day.  Was evaluated by PCP and was referred to the ED for admission.  X-ray showed osteomyelitis of the second middle and distal phalange on both sides of the second DIP joint.   Assessment & Plan:   Principal Problem:   Osteomyelitis (HCC) Active Problems:   Neuropathy   1-Left second toe infection concern for osteomyelitis by x-ray: -Presented with left second toe swelling erythema and pain for the last several days -X-ray finding consistent with osteomyelitis of the second middle and distal phalange -Continue IV ceftriaxone, vancomycin and Flagyl -Orthopedic has been consulted, Dr Lajoyce Corners.  -MRI:  Multilobular rim enhancing fluid bordering the dorsal aspect of the middle and distal phalanges of the second toe suggesting an abscess, overall in a region measuring up to approximately 1.5 x 0.8 x 1.9 cm.  Osteomyelitis of the distal and middle phalanges of the second toe.   Neuropathy: B 12; 363.   Hypertension: Resume metoprolol tomorrow. Holder parameter for bradycardia.  Hold Lisinopril , hydrochlorothiazide for now in setting of infection.   Estimated body mass index is 27.31 kg/m as calculated from the following:   Height as of this encounter: 5\' 9"  (1.753 m).   Weight as of this encounter: 83.9 kg.   DVT prophylaxis: Lovenox Code Status: Full code Family Communication: Disposition Plan:  Status is: Observation The patient remains OBS appropriate and will d/c before 2 midnights.    Consultants:  Dr Lajoyce Corners  Procedures:  None  Antimicrobials:    Subjective: He is alert, no new complaints.  Report symptoms ,  toe swelling started on Monday   Objective: Vitals:   08/19/23 2100 08/19/23 2155 08/20/23 0127 08/20/23 0521  BP: 136/72 (!) 117/94 (!) 149/66 120/69  Pulse: 77 65 (!) 58 64  Resp: 18 18 18 20   Temp: 98.2 F (36.8 C) 97.7 F (36.5 C) 97.8 F (36.6 C) 98.5 F (36.9 C)  TempSrc: Oral     SpO2: 98% 99% 98% 98%  Weight:      Height:        Intake/Output Summary (Last 24 hours) at 08/20/2023 0739 Last data filed at 08/20/2023 0521 Gross per 24 hour  Intake 0 ml  Output --  Net 0 ml   Filed Weights   08/19/23 1712  Weight: 83.9 kg    Examination:  General exam: Appears calm and comfortable  Respiratory system: Clear to auscultation. Respiratory effort normal. Cardiovascular system: S1 & S2 heard, RRR. No JVD, murmurs, rubs, gallops or clicks. No pedal edema. Gastrointestinal system: Abdomen is nondistended, soft and nontender. No organomegaly or masses felt. Normal bowel sounds heard. Central nervous system: Alert and oriented. No focal neurological deficits. Extremities: Left foot edema, second toe   Data Reviewed: I have personally reviewed following labs and imaging studies  CBC: Recent Labs  Lab 08/19/23 1828 08/20/23 0430  WBC 12.5* 11.2*  NEUTROABS 8.7*  --   HGB 14.6 13.4  HCT 43.9 39.5  MCV 91.6 91.2  PLT 228 241   Basic Metabolic Panel: Recent Labs  Lab 08/19/23 1828 08/20/23 0430  NA 138 138  K 3.9 3.7  CL 102 104  CO2 24  25  GLUCOSE 108* 95  BUN 25* 19  CREATININE 0.83 0.76  CALCIUM 9.2 8.6*   GFR: Estimated Creatinine Clearance: 68.7 mL/min (by C-G formula based on SCr of 0.76 mg/dL). Liver Function Tests: Recent Labs  Lab 08/19/23 1902  AST 23  ALT 14  ALKPHOS 59  BILITOT 0.9  PROT 6.8  ALBUMIN 3.7   No results for input(s): "LIPASE", "AMYLASE" in the last 168 hours. No results for input(s): "AMMONIA" in the last 168 hours. Coagulation Profile: Recent Labs  Lab 08/20/23 0430  INR 1.1   Cardiac Enzymes: No results for  input(s): "CKTOTAL", "CKMB", "CKMBINDEX", "TROPONINI" in the last 168 hours. BNP (last 3 results) No results for input(s): "PROBNP" in the last 8760 hours. HbA1C: No results for input(s): "HGBA1C" in the last 72 hours. CBG: No results for input(s): "GLUCAP" in the last 168 hours. Lipid Profile: No results for input(s): "CHOL", "HDL", "LDLCALC", "TRIG", "CHOLHDL", "LDLDIRECT" in the last 72 hours. Thyroid Function Tests: No results for input(s): "TSH", "T4TOTAL", "FREET4", "T3FREE", "THYROIDAB" in the last 72 hours. Anemia Panel: Recent Labs    08/20/23 0430  VITAMINB12 363   Sepsis Labs: Recent Labs  Lab 08/19/23 1914  LATICACIDVEN 1.1    Recent Results (from the past 240 hours)  Blood Cultures x 2 sites     Status: None (Preliminary result)   Collection Time: 08/19/23  7:02 PM   Specimen: BLOOD  Result Value Ref Range Status   Specimen Description   Final    BLOOD RIGHT ANTECUBITAL Performed at [facility], [address] [place], [place] [address]    Special Requests   Final    BOTTLES DRAWN AEROBIC AND ANAEROBIC Blood Culture results may not be optimal due to an inadequate volume of blood received in culture bottles Performed at [facility], [address], [place], [place] [address]    Culture   Final    NO GROWTH < 12 HOURS Performed at [facility], [address] [place], [place] [address]    Report Status PENDING  Incomplete  Blood Cultures x 2 sites     Status: None (Preliminary result)   Collection Time: 08/19/23 10:27 PM   Specimen: BLOOD LEFT ARM  Result Value Ref Range Status   Specimen Description   Final    BLOOD LEFT ARM Performed at [facility], [address] [place], [place] [address]    Special Requests   Final    BOTTLES DRAWN AEROBIC AND ANAEROBIC Blood Culture results may not be optimal due to an inadequate volume of blood received in culture bottles Performed at [facility] [address], [place], [place] [address]    Culture   Final    NO GROWTH < 12 HOURS Performed at [facility], [address], [place], [place] [address]    Report Status PENDING  Incomplete         Radiology Studies: DG Toe 2nd Left Result Date: 08/19/2023 CLINICAL DATA:  Left 2nd toe infection and drainage. Clinical concern for osteomyelitis. EXAM: LEFT SECOND TOE COMPARISON:  None Available. FINDINGS: Diffuse soft tissue swelling involving the 2nd toe. Small area of bone destruction involving the dorsomedial aspect of the base of the 2nd distal phalanx and moderate-sized area of bone destruction involving the dorsomedial aspect of the distal 2nd middle phalanx. No soft tissue calcifications or gas seen. IMPRESSION: Osteomyelitis of the 2nd middle and distal phalanges, on both  sides of the 2nd DIP joint. 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 Electronically Signed   By: Beckie Salts M.D.   On: 08/19/2023 18:33        Scheduled Meds:  enoxaparin (LOVENOX) injection  40 mg Subcutaneous Q24H   sodium chloride flush  3 mL Intravenous Q12H   Continuous Infusions:  cefTRIAXone (ROCEPHIN)  IV 2 g (08/19/23 2320)   metronidazole 500 mg (08/20/23 0004)   vancomycin 1,750 mg (08/19/23 2357)     LOS: 0 days    Time spent: 35 minutes    Macoy Rodwell A Keanon Bevins, MD [facility]   If 7PM-7AM, please contact night-coverage [email or web address]  08/20/2023, 7:39 AM

## 2023-08-20 NOTE — Consult Note (Signed)
 ORTHOPAEDIC CONSULTATION  REQUESTING PHYSICIAN: Alba Cory, MD  Chief Complaint: Swelling, cellulitis, purulent drainage left second toe.   HPI: Theodore Dominguez is a 83 y.o. male who presents with Chronic osteomyelitis with purulent drainage from the left second toe.  Patient is status post a transmetatarsal amputation on the right in 2016.  Past Medical History:  Diagnosis Date   Cancer [facility])    Cataract of both eyes    immature   Hypertension    Prostate cancer (HCC)    2002   Past Surgical History:  Procedure Laterality Date   AMPUTATION Right 03/23/2014   Procedure: 1st Ray Amputation Right Foot;  Surgeon: Nadara Mustard, MD;  Location: [facility] OR;  Service: Orthopedics;  Laterality: Right;   AMPUTATION Right 12/21/2014   Procedure: Right Transmetatarsal Amputation;  Surgeon: Nadara Mustard, MD;  Location: [facility] OR;  Service: Orthopedics;  Laterality: Right;   COLONOSCOPY     cyst on head     back of head/ 2011   POLYPECTOMY     PROSTATE SURGERY     Social History   Socioeconomic History   Marital status: Divorced    Spouse name: Not on file   Number of children: Not on file   Years of education: Not on file   Highest education level: Not on file  Occupational History   Not on file  Tobacco Use   Smoking status: Never   Smokeless tobacco: Never  Substance and Sexual Activity   Alcohol use: Yes    Comment: " a glass of wine avery once in awhile'   Drug use: No   Sexual activity: Not on file  Other Topics Concern   Not on file  Social History Narrative   Not on file   Social Drivers of Health   Financial Resource Strain: Not on file  Food Insecurity: No Food Insecurity (08/20/2023)   Hunger Vital Sign    Worried About Running Out of Food in the Last Year: Never true    Ran Out of Food in the Last Year: Never true  Transportation Needs: No Transportation Needs (08/20/2023)   PRAPARE - Administrator, Civil Service (Medical): No    Lack of  Transportation (Non-Medical): No  Physical Activity: Not on file  Stress: Not on file  Social Connections: Unknown (08/20/2023)   Social Connection and Isolation Panel [NHANES]    Frequency of Communication with Friends and Family: More than three times a week    Frequency of Social Gatherings with Friends and Family: Once a week    Attends Religious Services: Patient declined    Database administrator or Organizations: Patient declined    Attends Engineer, structural: Patient declined    Marital Status: Patient declined   Family History  Problem Relation Age of Onset   Colon cancer Neg Hx    - negative except otherwise stated in the family history section No Known Allergies Prior to Admission medications   Medication Sig Start Date End Date Taking? Authorizing Provider  calcium-vitamin D (OSCAL WITH D) 500-200 MG-UNIT per tablet Take 1 tablet by mouth daily.   Yes [provider]  fish oil-omega-3 fatty acids 1000 MG capsule Take 1 g by mouth daily.    Yes [provider]  lisinopril-hydrochlorothiazide (PRINZIDE,ZESTORETIC) 20-25 MG per tablet Take 1 tablet by mouth every morning.    Yes [provider]  metoprolol succinate (TOPROL-XL) 50 MG 24 hr tablet Take 50  mg by mouth daily. Take with or immediately following a meal.   Yes [provider]  Multiple Vitamin (MULTIVITAMIN) tablet Take 1 tablet by mouth daily. Senior MVI-Take one daily   Yes [provider]  vitamin E 400 UNIT capsule Take 400 Units by mouth daily.   Yes [provider]  Ascorbic Acid (VITAMIN C) 1000 MG tablet Take 1,000 mg by mouth daily.  Patient not taking: Reported on 08/19/2023    [provider]  doxycycline (VIBRAMYCIN) 100 MG capsule Take 100 mg by mouth 2 (two) times daily. For 15 days Patient not taking: Reported on 08/19/2023 03/21/14   [provider]   MR FOOT LEFT W WO CONTRAST Result Date: 08/20/2023 CLINICAL DATA:   Left second toe started swelling. Clear drainage from toe. Worsened over the past few days. History of osteomyelitis in the right foot. EXAM: MRI OF THE LEFT FOREFOOT WITHOUT AND WITH CONTRAST TECHNIQUE: Multiplanar, multisequence MR imaging of the left forefoot was performed both before and after administration of intravenous contrast. CONTRAST:  8mL GADAVIST GADOBUTROL 1 MMOL/ML IV SOLN COMPARISON:  left second toe radiographs 08/19/2023, 10/04/2017; FINDINGS: Soft tissues/bones/Joint/Cartilage There is moderate distal second toe soft tissue swelling and enhancement. There is multilobular rim enhancing fluid bordering the dorsal aspect of the middle and distal phalanges of the second toe (sagittal series 8 images 17 through 20) suggesting an abscess, overall in a region measuring up to approximately 1.5 x 0.8 x 1.9 cm (transverse by short axis of the foot by long axis of the foot). There is high-grade distal phalanx, moderate to high-grade middle phalanx, and moderate distal aspect of the proximal phalanx of the second toe marrow edema. Cortical erosion of the distal phalanx and distal aspect of the middle phalanx of the second toe. Findings are indicative of osteomyelitis of the distal and middle phalanges of the second toe. Mild-to-moderate hallux valgus. 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 Moderate to severe second and moderate third through fifth tarsometatarsal cartilage thinning with second tarsometatarsal subchondral cystic change and marrow edema. There also chronic cystic changes on both sides of the adjacent shafts of the third and fourth metatarsals (coronal series 7 images 10 through 12, nonspecific and possibly degenerative. Mild dorsolateral midfoot to forefoot subcutaneous fat edema and swelling.  Ligaments The Lisfranc ligament complex is intact. Muscles and Tendons Mild-to-moderate intrinsic plantar midfoot to forefoot muscle edema, nonspecific. They flexor and extensor tendons are intact. IMPRESSION: 1. Multilobular rim enhancing fluid bordering the dorsal aspect of the middle and distal phalanges of the second toe suggesting an abscess, overall in a region measuring up to approximately 1.5 x 0.8 x 1.9 cm. 2. Osteomyelitis of the distal and middle phalanges of the second toe. 3. Mild-to-moderate hallux valgus. 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 4. Moderate to severe second and moderate third through fifth tarsometatarsal osteoarthritis. Electronically Signed   By: Neita Garnet M.D.   On: 08/20/2023 09:16   DG Toe 2nd Left Result Date: 08/19/2023 CLINICAL DATA:  Left 2nd toe infection and drainage. Clinical concern for osteomyelitis. EXAM: LEFT SECOND TOE COMPARISON:  None Available. FINDINGS: Diffuse soft tissue swelling involving the 2nd toe. Small area of bone destruction involving the dorsomedial aspect of the base of the 2nd distal phalanx and moderate-sized area of bone destruction involving the dorsomedial aspect of the distal 2nd middle phalanx. No soft tissue calcifications or  gas seen. 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 Electronically Signed   By: Beckie Salts M.D.   On: 08/19/2023 18:33   - pertinent xrays, CT, MRI studies were reviewed and independently interpreted  Positive ROS: All other systems have been reviewed and were otherwise negative with the exception of those mentioned in the HPI and as above.  Physical Exam: General: Alert, no acute distress Psychiatric: Patient is competent for consent with normal mood and affect Lymphatic: No axillary or cervical lymphadenopathy Cardiovascular: No  pedal edema Respiratory: No cyanosis, no use of accessory musculature GI: No organomegaly, abdomen is soft and non-tender    Images:  @ENCIMAGES @  Labs:  Lab Results  Component Value Date   REPTSTATUS PENDING 08/19/2023   GRAMSTAIN  10/04/2017    NO WBC SEEN NO SQUAMOUS EPITHELIAL CELLS SEEN NO ORGANISMS SEEN Performed at [facility], [address], [place], [place] [address]    CULT  08/19/2023    NO GROWTH < 12 HOURS Performed at [facility], [address] [place], [place] [address]    [facility] STAPHYLOCOCCUS AUREUS 10/04/2017    Lab Results  Component Value Date   ALBUMIN 3.7 08/19/2023   ALBUMIN 3.4 (L) 12/21/2014   ALBUMIN 3.2 (L) 03/23/2014        Latest Ref Rng & Units 08/20/2023    4:30 AM 08/19/2023    6:28 PM 12/21/2014    2:12 PM  CBC EXTENDED  WBC 4.0 - 10.5 K/uL 11.2  12.5  13.8   RBC 4.22 - 5.81 MIL/uL 4.33  4.79  4.56   Hemoglobin 13.0 - 17.0 g/dL 29.5  62.1  30.8   HCT 39.0 - 52.0 % 39.5  43.9  39.9   Platelets 150 - 400 K/uL 241  228  257   NEUT# 1.7 - 7.7 K/uL  8.7    Lymph# 0.7 - 4.0 K/uL  2.2      Neurologic: Patient does not have protective sensation bilateral lower extremities.   MUSCULOSKELETAL:   Skin: Examination patient has sausage digit swelling of the left second toe.  There is purulent drainage from the PIP joint medially.  Patient has a strong posterior tibial pulse.  White cell count 11.2   Radiographs show destructive bony changes of the DIP joint of the left second toe  Review of the MRI scan shows abscess of the second toe with osteomyelitis of the distal and middle phalanx.  Assessment: Assessment: Osteomyelitis and abscess of the left second toe.  Plan: Plan: I offered surgical intervention tomorrow for amputation of the second toe.  Patient states he would like to wait until next week.  I feel that his safe for him to discharge on oral doxycycline and my office will call him to set up  surgery.  Thank you for the consult and the opportunity to see Mr. Theodore Amsden, MD [facility] [phone] 3:52 PM

## 2023-08-20 NOTE — Discharge Summary (Signed)
 Physician Discharge Summary   Patient: Theodore Dominguez MRN: [id number] DOB: [birth year]  Admit date:     08/19/2023  Discharge date: 08/20/23  Discharge Physician: Alba Cory   PCP: [facility]   Recommendations at discharge:   Needs close follow up with Dr Lajoyce Corners, discharge on Doxy and Keflex.   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 *  Hospital Course: 82 year old with past medical history significant for prediabetes and prior toe amputation due to osteomyelitis. Reports left second toe swelling and drainage that is started 4 days prior to admission. For over the last day. Was evaluated by PCP and was referred to the ED for admission. X-ray showed osteomyelitis of the second middle and distal phalange on both sides of the second DIP joint.   Assessment and Plan: 1-Left second toe infection concern for osteomyelitis by x-ray: -Presented with left second toe swelling erythema and pain for the last several days -X-ray finding consistent with osteomyelitis of the second middle and distal phalange -Continue IV ceftriaxone, vancomycin and Flagyl -Orthopedic has been consulted, Dr Lajoyce Corners.  -MRI:  Multilobular rim enhancing fluid bordering the dorsal aspect of the middle and distal phalanges of the second toe suggesting an abscess, overall in a region measuring up to approximately 1.5 x 0.8 x 1.9 cm.  Osteomyelitis of the distal and middle phalanges of the second toe.     Neuropathy: B 12; 363.    Hypertension: Resume metoprolol tomorrow. Holder parameter for bradycardia.  Hold Lisinopril , hydrochlorothiazide for now in setting of infection.    Estimated body mass index is 27.31 kg/m as calculated from the following:   Height as of this encounter: 5\' 9"  (1.753 m).   Weight as of this encounter: 83.9 kg.         Consultants: Dr Lajoyce Corners Procedures performed: none Disposition: Home Diet recommendation:  Discharge Diet Orders (From admission, onward)     Start     Ordered   08/20/23 0000  Diet - low sodium heart healthy        08/20/23 1609           Carb modified diet DISCHARGE MEDICATION: Allergies as of 08/20/2023   No Known Allergies      Medication List     STOP taking these medications    doxycycline 100 MG capsule Commonly known as: VIBRAMYCIN Replaced by: doxycycline 100 MG tablet       TAKE these medications    amoxicillin-clavulanate 875-125 MG tablet Commonly known as: AUGMENTIN Take 1 tablet by mouth 2 (two) times daily for 7 days.   calcium-vitamin D 500-200 MG-UNIT tablet Commonly known as: OSCAL WITH D Take 1 tablet by mouth daily.   doxycycline 100 MG tablet Commonly known as: VIBRA-TABS Take 1 tablet (100 mg total) by mouth 2 (two) times daily for 7 days. Replaces: doxycycline 100 MG capsule   fish oil-omega-3 fatty acids 1000 MG capsule Take 1 g by mouth daily.   lisinopril-hydrochlorothiazide 20-25 MG tablet Commonly known as: ZESTORETIC Take 1 tablet by mouth every morning.   metoprolol succinate 50 MG 24 hr tablet Commonly known as: TOPROL-XL Take 50 mg by mouth daily. Take with or immediately following a meal.   multivitamin tablet Take 1 tablet by mouth daily. Senior MVI-Take one daily   vitamin C 1000 MG tablet Take 1,000 mg by  mouth daily.   vitamin E 180 MG (400 UNITS) capsule Take 400 Units by mouth daily.               Discharge Care Instructions  (From admission, onward)           Start     Ordered   08/20/23 0000  Discharge wound care:       Comments: See above   08/20/23 1609            Follow-up Information     Nadara Mustard, MD Follow up in 2 week(s).   Specialty: Orthopedic Surgery Contact information: [address] [place] [address] [phone]                Discharge  Exam: Ceasar Mons Weights   08/19/23 1712  Weight: 83.9 kg   General; NAD  Condition at discharge: stable  The results of significant diagnostics from this hospitalization (including imaging, microbiology, ancillary and laboratory) are listed below for reference.   Imaging Studies: MR FOOT LEFT W WO CONTRAST Result Date: 08/20/2023 CLINICAL DATA:  Left second toe started swelling. Clear drainage from toe. Worsened over the past few days. History of osteomyelitis in the right foot. EXAM: MRI OF THE LEFT FOREFOOT WITHOUT AND WITH CONTRAST TECHNIQUE: Multiplanar, multisequence MR imaging of the left forefoot was performed both before and after administration of intravenous contrast. CONTRAST:  8mL GADAVIST GADOBUTROL 1 MMOL/ML IV SOLN COMPARISON:  left second toe radiographs 08/19/2023, 10/04/2017; FINDINGS: Soft tissues/bones/Joint/Cartilage There is moderate distal second toe soft tissue swelling and enhancement. There is multilobular rim enhancing fluid bordering the dorsal aspect of the middle and distal phalanges of the second toe (sagittal series 8 images 17 through 20) suggesting an abscess, overall in a region measuring up to approximately 1.5 x 0.8 x 1.9 cm (transverse by short axis of the foot by long axis of the foot). There is high-grade distal phalanx, moderate to high-grade middle phalanx, and moderate distal aspect of the proximal phalanx of the second toe marrow edema. Cortical erosion of the distal phalanx and distal aspect of the middle phalanx of the second toe. Findings are indicative of osteomyelitis of the distal and middle phalanges of the second toe. Mild-to-moderate hallux valgus. 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 Moderate to severe second and moderate third through fifth tarsometatarsal  cartilage thinning with second tarsometatarsal subchondral cystic change and marrow edema. There also chronic cystic changes on both sides of the adjacent shafts of the third and fourth metatarsals (coronal series 7 images 10 through 12, nonspecific and possibly degenerative. Mild dorsolateral midfoot to forefoot subcutaneous fat edema and swelling. Ligaments The Lisfranc ligament complex is intact. Muscles and Tendons Mild-to-moderate intrinsic plantar midfoot to forefoot muscle edema, nonspecific. They flexor and extensor tendons are intact. IMPRESSION: 1. Multilobular rim enhancing fluid bordering the dorsal aspect of the middle and distal phalanges of the second toe suggesting an abscess, overall in a region measuring up to approximately 1.5 x 0.8 x 1.9 cm. 2. Osteomyelitis of the distal and middle phalanges of the second toe. 3. Mild-to-moderate hallux valgus. 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 4. Moderate to severe second and moderate third through fifth tarsometatarsal osteoarthritis. Electronically Signed   By: Neita Garnet M.D.   On: 08/20/2023 09:16   DG Toe 2nd Left Result Date: 08/19/2023 CLINICAL DATA:  Left 2nd toe infection and  drainage. Clinical concern for osteomyelitis. EXAM: LEFT SECOND TOE COMPARISON:  None Available. FINDINGS: Diffuse soft tissue swelling involving the 2nd toe. Small area of bone destruction involving the dorsomedial aspect of the base of the 2nd distal phalanx and moderate-sized area of bone destruction involving the dorsomedial aspect of the distal 2nd middle phalanx. No soft tissue calcifications or gas seen. 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 Electronically Signed   By: Beckie Salts M.D.   On: 08/19/2023 18:33    Microbiology: Results for orders placed or performed during the hospital  encounter of 08/19/23  Blood Cultures x 2 sites     Status: None (Preliminary result)   Collection Time: 08/19/23  7:02 PM   Specimen: BLOOD  Result Value Ref Range Status   Specimen Description   Final    BLOOD RIGHT ANTECUBITAL Performed at [facility], [address], [place], [place] [address]    Special Requests   Final    BOTTLES DRAWN AEROBIC AND ANAEROBIC Blood Culture results may not be optimal due to an inadequate volume of blood received in culture bottles Performed at [facility], [address] [place], [place] [address]    Culture   Final    NO GROWTH < 12 HOURS Performed at [facility], [address] [place], [place] [address]    Report Status PENDING  Incomplete  Blood Cultures x 2 sites     Status: None (Preliminary result)   Collection Time: 08/19/23 10:27 PM   Specimen: BLOOD LEFT ARM  Result Value Ref Range Status   Specimen Description   Final    BLOOD LEFT ARM Performed at [facility], [address] [place], [place] [address]    Special Requests   Final    BOTTLES DRAWN AEROBIC AND ANAEROBIC Blood Culture results may not be optimal due to an inadequate volume of blood received in culture bottles Performed at [facility], [address] [place], [place] [address]    Culture   Final    NO GROWTH < 12 HOURS Performed at [facility], [address], [place], [place] [address]    Report Status PENDING  Incomplete    Labs: CBC: Recent Labs  Lab 08/19/23 1828 08/20/23 0430  WBC 12.5* 11.2*  NEUTROABS 8.7*  --   HGB 14.6 13.4  HCT 43.9 39.5  MCV 91.6 91.2  PLT 228 241   Basic Metabolic Panel: Recent Labs  Lab 08/19/23 1828 08/20/23 0430  NA 138 138  K 3.9 3.7  CL 102 104  CO2 24 25  GLUCOSE 108* 95  BUN 25* 19  CREATININE 0.83 0.76  CALCIUM 9.2 8.6*   Liver Function Tests: Recent Labs  Lab 08/19/23 1902  AST 23  ALT 14  ALKPHOS 59   BILITOT 0.9  PROT 6.8  ALBUMIN 3.7   CBG: No results for input(s): "GLUCAP" in the last 168 hours.  Discharge time spent: greater than 30 minutes.  Signed: Alba Cory, MD [facility] 08/20/2023

## 2023-08-20 NOTE — Consult Note (Signed)
 WOC Nurse Consult Note: this patient has been admitted for ?osteomyelitis L 2nd toe and Dr. Lajoyce Corners has been consulted Reason for Consult: L foot 2nd toe wound  Wound type: full thickness r/t neuropathy  Pressure Injury POA: NA  Measurement: see nursing flowsheet  Wound XBM:WUXLKGM pink and moist, some dry hemorrhagic tissue noted  Drainage (amount, consistency, odor)  minimal clear per MD note  Periwound: edema and erythema of 2nd digit  Dressing procedure/placement/frequency: Cleanse L 2nd toe ulcer with NS, apply a small piece of silver hydrofiber [employer] [place] #[id number] Aqu[employer]) cut to cover wound bed.  Secure with silicone foam or dry gauze and tape.    Osteomyelitis is out of the scope of practice of the WOC RN.  Local wound care orders have been placed until can be evaluated by Dr. Lajoyce Corners.  Any wound care orders placed by Dr. Lajoyce Corners will supercede wound care orders placed by WOC.   POC discussed with bedside nurse. WOC team will not follow. Re-consult if further needs arise.   Thank you,    Priscella Mann MSN, RN-BC, [employer] [phone]

## 2023-08-23 LAB — VITAMIN B1: Vitamin B1 (Thiamine): 124.7 nmol/L (ref 66.5–200.0)

## 2023-08-24 LAB — CULTURE, BLOOD (ROUTINE X 2): Culture: NO GROWTH

## 2023-08-25 ENCOUNTER — Other Ambulatory Visit: Payer: Self-pay | Admitting: Family

## 2023-08-25 LAB — CULTURE, BLOOD (ROUTINE X 2): Culture: NO GROWTH

## 2023-08-25 MED ORDER — DOXYCYCLINE HYCLATE 100 MG PO TABS: 100.0000 mg | ORAL_TABLET | Freq: Two times a day (BID) | ORAL | 0 refills | Status: DC

## 2023-08-25 MED ORDER — AMOXICILLIN-POT CLAVULANATE 875-125 MG PO TABS: 1.0000 | ORAL_TABLET | Freq: Two times a day (BID) | ORAL | 0 refills | Status: DC

## 2023-08-31 ENCOUNTER — Encounter (HOSPITAL_COMMUNITY): Payer: Self-pay | Admitting: Orthopedic Surgery

## 2023-08-31 ENCOUNTER — Other Ambulatory Visit: Payer: Self-pay

## 2023-08-31 NOTE — Progress Notes (Signed)
 PCP - Jorge Ny, NP Cardiologist - none  Chest x-ray - n/a EKG - DOS Stress Test - n/a ECHO - n/a Cardiac Cath - n/a  ICD Pacemaker/Loop - n/a  Sleep Study -  n/a  Diabetes - n/a  Aspirin & Blood Thinner Instructions:  n/a  ERAS - clear  Anesthesia review: no  STOP now taking any Aspirin (unless otherwise instructed by your surgeon), Aleve, Naproxen, Ibuprofen, Motrin, Advil, Goody's, BC's, all herbal medications, fish oil, and all vitamins.   Coronavirus Screening Do you have any of the following symptoms:  Cough yes/no: No Fever (>100.83F)  yes/no: No Runny nose yes/no: No Sore throat yes/no: No Difficulty breathing/shortness of breath  yes/no: No  Have you traveled in the last 14 days and where? yes/no: No  Patient verbalized understanding of instructions that were given via phone.

## 2023-08-31 NOTE — Anesthesia Preprocedure Evaluation (Signed)
 Anesthesia Evaluation  Patient identified by MRN, date of birth, ID band Patient awake    Reviewed: Allergy & Precautions, NPO status , Patient's Chart, lab work & pertinent test results  History of Anesthesia Complications Negative for: history of anesthetic complications  Airway Mallampati: III  TM Distance: >3 FB Neck ROM: Full    Dental  (+) Dental Advisory Given   Pulmonary neg pulmonary ROS   Pulmonary exam normal breath sounds clear to auscultation       Cardiovascular hypertension (lisinopril-HCTZ, metoprolol), Pt. on medications and Pt. on home beta blockers (-) angina (-) Past MI, (-) Cardiac Stents and (-) CABG (-) dysrhythmias  Rhythm:Regular Rate:Normal  HLD   Neuro/Psych neg Seizures  Neuromuscular disease (neuropathy)    GI/Hepatic negative GI ROS, Neg liver ROS,,,  Endo/Other  Pre-diabetes  Renal/GU negative Renal ROS   Prostate cancer    Musculoskeletal osteomyelitis   Abdominal   Peds  Hematology negative hematology ROS (+) Lab Results      Component                Value               Date                      WBC                      11.2 (H)            08/20/2023                HGB                      13.4                08/20/2023                HCT                      39.5                08/20/2023                MCV                      91.2                08/20/2023                PLT                      241                 08/20/2023              Anesthesia Other Findings   Reproductive/Obstetrics                             Anesthesia Physical Anesthesia Plan  ASA: 3  Anesthesia Plan: MAC   Post-op Pain Management: Tylenol PO (pre-op)*   Induction: Intravenous  PONV Risk Score and Plan: 1 and Ondansetron, Dexamethasone, Propofol infusion, TIVA and Treatment may vary due to age or medical condition  Airway Management Planned: Natural Airway and  Simple Face Mask  Additional Equipment:   Intra-op Plan:   Post-operative Plan: Extubation in OR  Informed Consent:  I have reviewed the patients History and Physical, chart, labs and discussed the procedure including the risks, benefits and alternatives for the proposed anesthesia with the patient or authorized representative who has indicated his/her understanding and acceptance.     Dental advisory given  Plan Discussed with: CRNA and Anesthesiologist  Anesthesia Plan Comments: (Discussed with patient risks of MAC including, but not limited to, minor pain or discomfort, hearing people in the room, and possible need for backup general anesthesia. Risks for general anesthesia also discussed including, but not limited to, sore throat, hoarse voice, chipped/damaged teeth, injury to vocal cords, nausea and vomiting, allergic reactions, lung infection, heart attack, stroke, and death. All questions answered.  )        Anesthesia Quick Evaluation

## 2023-09-01 ENCOUNTER — Ambulatory Visit (HOSPITAL_COMMUNITY): Payer: Self-pay | Admitting: Anesthesiology

## 2023-09-01 ENCOUNTER — Encounter (HOSPITAL_COMMUNITY)
Admission: RE | Disposition: A | Discharge status: Home / Self Care | Payer: Self-pay | Source: Ambulatory Visit | Attending: Orthopedic Surgery

## 2023-09-01 ENCOUNTER — Other Ambulatory Visit: Payer: Self-pay

## 2023-09-01 ENCOUNTER — Ambulatory Visit (HOSPITAL_COMMUNITY)
Admission: RE | Admit: 2023-09-01 | Discharge: 2023-09-01 | Disposition: A | Discharge status: Home / Self Care | Source: Ambulatory Visit | Attending: Orthopedic Surgery | Admitting: Orthopedic Surgery

## 2023-09-01 ENCOUNTER — Ambulatory Visit (HOSPITAL_BASED_OUTPATIENT_CLINIC_OR_DEPARTMENT_OTHER): Payer: Self-pay | Admitting: Anesthesiology

## 2023-09-01 ENCOUNTER — Encounter (HOSPITAL_COMMUNITY): Payer: Self-pay | Admitting: Orthopedic Surgery

## 2023-09-01 DIAGNOSIS — E785 Hyperlipidemia, unspecified: Secondary | ICD-10-CM

## 2023-09-01 DIAGNOSIS — Z89431 Acquired absence of right foot: Secondary | ICD-10-CM | POA: Diagnosis not present

## 2023-09-01 DIAGNOSIS — M86172 Other acute osteomyelitis, left ankle and foot: Secondary | ICD-10-CM | POA: Diagnosis not present

## 2023-09-01 DIAGNOSIS — Z79899 Other long term (current) drug therapy: Secondary | ICD-10-CM | POA: Diagnosis not present

## 2023-09-01 DIAGNOSIS — M869 Osteomyelitis, unspecified: Secondary | ICD-10-CM

## 2023-09-01 DIAGNOSIS — I10 Essential (primary) hypertension: Secondary | ICD-10-CM | POA: Diagnosis not present

## 2023-09-01 DIAGNOSIS — M86672 Other chronic osteomyelitis, left ankle and foot: Secondary | ICD-10-CM | POA: Insufficient documentation

## 2023-09-01 HISTORY — DX: Prediabetes: R73.03

## 2023-09-01 HISTORY — DX: Hyperlipidemia, unspecified: E78.5

## 2023-09-01 HISTORY — PX: AMPUTATION TOE: SHX6595

## 2023-09-01 SURGERY — AMPUTATION, TOE
Anesthesia: Monitor Anesthesia Care | Site: Toe | Laterality: Left

## 2023-09-01 MED ORDER — 0.9 % SODIUM CHLORIDE (POUR BTL) OPTIME
TOPICAL | Status: DC | PRN
  Administered 2023-09-01: 1000 mL

## 2023-09-01 MED ORDER — CEFAZOLIN SODIUM-DEXTROSE 2-4 GM/100ML-% IV SOLN
2.0000 g | INTRAVENOUS | Status: AC
  Administered 2023-09-01: 2 g via INTRAVENOUS
  Filled 2023-09-01: qty 100

## 2023-09-01 MED ORDER — FENTANYL CITRATE (PF) 100 MCG/2ML IJ SOLN: 25.0000 ug | INTRAMUSCULAR | Status: DC | PRN

## 2023-09-01 MED ORDER — OXYCODONE HCL 5 MG PO TABS: 5.0000 mg | ORAL_TABLET | Freq: Once | ORAL | Status: DC | PRN

## 2023-09-01 MED ORDER — CHLORHEXIDINE GLUCONATE 0.12 % MT SOLN
15.0000 mL | Freq: Once | OROMUCOSAL | Status: AC
  Administered 2023-09-01: 15 mL via OROMUCOSAL
  Filled 2023-09-01: qty 15

## 2023-09-01 MED ORDER — PROPOFOL 10 MG/ML IV BOLUS
INTRAVENOUS | Status: AC
  Filled 2023-09-01: qty 20

## 2023-09-01 MED ORDER — OXYCODONE-ACETAMINOPHEN 5-325 MG PO TABS: 1.0000 | ORAL_TABLET | ORAL | 0 refills | Status: AC | PRN

## 2023-09-01 MED ORDER — LIDOCAINE HCL (PF) 1 % IJ SOLN
INTRAMUSCULAR | Status: AC
  Filled 2023-09-01: qty 30

## 2023-09-01 MED ORDER — ACETAMINOPHEN 500 MG PO TABS
1000.0000 mg | ORAL_TABLET | Freq: Once | ORAL | Status: AC
  Administered 2023-09-01: 1000 mg via ORAL
  Filled 2023-09-01: qty 2

## 2023-09-01 MED ORDER — VASHE WOUND IRRIGATION OPTIME
TOPICAL | Status: DC | PRN
  Administered 2023-09-01: 34 [oz_av]

## 2023-09-01 MED ORDER — AMISULPRIDE (ANTIEMETIC) 5 MG/2ML IV SOLN: 10.0000 mg | Freq: Once | INTRAVENOUS | Status: DC | PRN

## 2023-09-01 MED ORDER — PHENYLEPHRINE 80 MCG/ML (10ML) SYRINGE FOR IV PUSH (FOR BLOOD PRESSURE SUPPORT)
PREFILLED_SYRINGE | INTRAVENOUS | Status: DC | PRN
  Administered 2023-09-01: 80 ug via INTRAVENOUS

## 2023-09-01 MED ORDER — LIDOCAINE HCL (PF) 1 % IJ SOLN
INTRAMUSCULAR | Status: DC | PRN
  Administered 2023-09-01: 10 mL

## 2023-09-01 MED ORDER — ORAL CARE MOUTH RINSE: 15.0000 mL | Freq: Once | OROMUCOSAL | Status: AC

## 2023-09-01 MED ORDER — BUPIVACAINE HCL (PF) 0.25 % IJ SOLN
INTRAMUSCULAR | Status: AC
  Filled 2023-09-01: qty 30

## 2023-09-01 MED ORDER — ONDANSETRON HCL 4 MG/2ML IJ SOLN
INTRAMUSCULAR | Status: DC | PRN
Start: 2023-09-01 — End: 2023-09-01
  Administered 2023-09-01: 4 mg via INTRAVENOUS

## 2023-09-01 MED ORDER — OXYCODONE HCL 5 MG/5ML PO SOLN: 5.0000 mg | Freq: Once | ORAL | Status: DC | PRN

## 2023-09-01 MED ORDER — PROPOFOL 10 MG/ML IV BOLUS
INTRAVENOUS | Status: DC | PRN
  Administered 2023-09-01: 20 mg via INTRAVENOUS
  Administered 2023-09-01 (×10): 10 mg via INTRAVENOUS

## 2023-09-01 MED ORDER — BUPIVACAINE HCL (PF) 0.5 % IJ SOLN
INTRAMUSCULAR | Status: AC
  Filled 2023-09-01: qty 30

## 2023-09-01 MED ORDER — LACTATED RINGERS IV SOLN: INTRAVENOUS | Status: DC

## 2023-09-01 SURGICAL SUPPLY — 28 items
BAG COUNTER SPONGE SURGICOUNT (BAG) ×1 IMPLANT
BLADE SAW SGTL MED 73X18.5 STR (BLADE) IMPLANT
BLADE SURG 21 STRL SS (BLADE) ×1 IMPLANT
BNDG COHESIVE 4X5 TAN STRL LF (GAUZE/BANDAGES/DRESSINGS) ×1 IMPLANT
BNDG COHESIVE 6X5 TAN NS LF (GAUZE/BANDAGES/DRESSINGS) IMPLANT
BNDG GAUZE DERMACEA FLUFF 4 (GAUZE/BANDAGES/DRESSINGS) ×1 IMPLANT
CLEANSER WND VASHE INSTL 34OZ (WOUND CARE) IMPLANT
COVER SURGICAL LIGHT HANDLE (MISCELLANEOUS) ×2 IMPLANT
DRAPE U-SHAPE 47X51 STRL (DRAPES) ×2 IMPLANT
DRSG ADAPTIC 3X8 NADH LF (GAUZE/BANDAGES/DRESSINGS) ×1 IMPLANT
DURAPREP 26ML APPLICATOR (WOUND CARE) ×1 IMPLANT
ELECT REM PT RETURN 9FT ADLT (ELECTROSURGICAL) ×1 IMPLANT
ELECTRODE REM PT RTRN 9FT ADLT (ELECTROSURGICAL) ×1 IMPLANT
GAUZE PAD ABD 8X10 STRL (GAUZE/BANDAGES/DRESSINGS) ×2 IMPLANT
GAUZE SPONGE 4X4 12PLY STRL (GAUZE/BANDAGES/DRESSINGS) ×1 IMPLANT
GLOVE BIOGEL PI IND STRL 9 (GLOVE) ×1 IMPLANT
GLOVE SURG ORTHO 9.0 STRL STRW (GLOVE) ×1 IMPLANT
GOWN STRL REUS W/ TWL XL LVL3 (GOWN DISPOSABLE) ×2 IMPLANT
KIT BASIN OR (CUSTOM PROCEDURE TRAY) ×1 IMPLANT
KIT TURNOVER KIT B (KITS) ×1 IMPLANT
NS IRRIG 1000ML POUR BTL (IV SOLUTION) ×1 IMPLANT
PACK ORTHO EXTREMITY (CUSTOM PROCEDURE TRAY) ×1 IMPLANT
PAD ARMBOARD POSITIONER FOAM (MISCELLANEOUS) ×2 IMPLANT
STOCKINETTE IMPERVIOUS LG (DRAPES) IMPLANT
SUT ETHILON 2 0 PSLX (SUTURE) ×1 IMPLANT
TOWEL GREEN STERILE (TOWEL DISPOSABLE) ×1 IMPLANT
TUBE CONNECTING 12X1/4 (SUCTIONS) ×1 IMPLANT
YANKAUER SUCT BULB TIP NO VENT (SUCTIONS) ×1 IMPLANT

## 2023-09-01 NOTE — Op Note (Signed)
 09/01/2023  8:57 AM  PATIENT:  Theodore Dominguez    PRE-OPERATIVE DIAGNOSIS:  Osteomyelitis 2nd toe, left foot  POST-OPERATIVE DIAGNOSIS:  Same  PROCEDURE:  AMPUTATION, TOE, second toe at the MTP joint.  SURGEON:  Nadara Mustard, MD  PHYSICIAN ASSISTANT:None ANESTHESIA:   General  PREOPERATIVE INDICATIONS:  [name] is a  83 y.o. male with a diagnosis of Osteomyelitis 2nd toe, left foot who failed conservative measures and elected for surgical management.    The risks benefits and alternatives were discussed with the patient preoperatively including but not limited to the risks of infection, bleeding, nerve injury, cardiopulmonary complications, the need for revision surgery, among others, and the patient was willing to proceed.  OPERATIVE IMPLANTS:   * No implants in log *  @ENCIMAGES @  OPERATIVE FINDINGS: Tissue margins were clear there was good petechial bleeding.  OPERATIVE PROCEDURE: Patient was brought the operating room and underwent a MAC anesthetic.  Patient's left lower extremity was then prepped using DuraPrep draped into a sterile field a timeout was called.  Patient underwent local infiltration with 10 cc of 1% lidocaine plain.  After adequate levels anesthesia were obtained patient's second toe was amputated through the MTP joint with AV incision.  The toe was amputated through the MTP joint.  Electrocautery was used for hemostasis.  The wound was irrigated with Vashe.  Skin was closed using 2-0 nylon a sterile dressing was applied patient was taken the PACU in stable condition.   DISCHARGE PLANNING:  Antibiotic duration: Preoperative antibiotics  Weightbearing: Touchdown weightbearing on the left  Pain medication: Prescription for Percocet  Dressing care/ Wound VAC: Dry dressing change in the office at follow-up  Ambulatory devices: Crutches and postoperative shoe  Discharge to: Home.  Follow-up: In the office 1 week post operative.

## 2023-09-01 NOTE — Anesthesia Postprocedure Evaluation (Signed)
 Anesthesia Post Note  Patient: Theodore Dominguez  Procedure(s) Performed: AMPUTATION, TOE (Left: Toe)     Patient location during evaluation: PACU Anesthesia Type: MAC Level of consciousness: awake Pain management: pain level controlled Vital Signs Assessment: post-procedure vital signs reviewed and stable Respiratory status: spontaneous breathing, nonlabored ventilation and respiratory function stable Cardiovascular status: stable and blood pressure returned to baseline Postop Assessment: no apparent nausea or vomiting Anesthetic complications: no   No notable events documented.  Last Vitals:  Vitals:   09/01/23 0900 09/01/23 0915  BP: (!) 144/69 (!) 158/74  Pulse: (!) 52 (!) 55  Resp: 11 16  Temp: (!) 36.4 C (!) 36.4 C  SpO2: 98% 97%    Last Pain:  Vitals:   09/01/23 0915  TempSrc:   PainSc: 0-No pain                 Linton Rump

## 2023-09-01 NOTE — Anesthesia Procedure Notes (Signed)
 Procedure Name: MAC Date/Time: 09/01/2023 8:32 AM  Performed by: Alease Medina, CRNAPre-anesthesia Checklist: Patient identified, Emergency Drugs available, Suction available and Patient being monitored Patient Re-evaluated:Patient Re-evaluated prior to induction Oxygen Delivery Method: Simple face mask

## 2023-09-01 NOTE — H&P (Signed)
 Theodore Dominguez is an 83 y.o. male.   Chief Complaint: Abscess osteomyelitis left foot second toe. HPI: Theodore Dominguez is a 83 y.o. male who presents with Chronic osteomyelitis with purulent drainage from the left second toe.  Patient is status post a transmetatarsal amputation on the right in 2016.   Past Medical History:  Diagnosis Date   Cancer (HCC)    Cataract of both eyes    immature - md just watching   HLD (hyperlipidemia)    Hypertension    Osteomyelitis (HCC) 07/2023   left foot second toe   Pre-diabetes    no meds   Prostate cancer (HCC)    2002    Past Surgical History:  Procedure Laterality Date   AMPUTATION Right 03/23/2014   Procedure: 1st Ray Amputation Right Foot;  Surgeon: Nadara Mustard, MD;  Location: [facility] OR;  Service: Orthopedics;  Laterality: Right;   AMPUTATION Right 12/21/2014   Procedure: Right Transmetatarsal Amputation;  Surgeon: Nadara Mustard, MD;  Location: [facility] OR;  Service: Orthopedics;  Laterality: Right;   COLONOSCOPY     cyst on head     back of head/ 2011   POLYPECTOMY     PROSTATE SURGERY      Family History  Problem Relation Age of Onset   Colon cancer Neg Hx    Social History:  reports that he has never smoked. He has never used smokeless tobacco. He reports current alcohol use. He reports that he does not use drugs.  Allergies: No Known Allergies  Medications Prior to Admission  Medication Sig Dispense Refill   amoxicillin-clavulanate (AUGMENTIN) 875-125 MG tablet Take 1 tablet by mouth 2 (two) times daily for 7 days. 14 tablet 0   Ascorbic Acid (VITAMIN C) 1000 MG tablet Take 1,000 mg by mouth daily.  (Patient not taking: Reported on 08/19/2023)     calcium-vitamin D (OSCAL WITH D) 500-200 MG-UNIT per tablet Take 1 tablet by mouth daily.     doxycycline (VIBRA-TABS) 100 MG tablet Take 1 tablet (100 mg total) by mouth 2 (two) times daily for 7 days. 14 tablet 0   fish oil-omega-3 fatty acids 1000 MG capsule Take 1 g by mouth daily.       lisinopril-hydrochlorothiazide (PRINZIDE,ZESTORETIC) 20-25 MG per tablet Take 1 tablet by mouth every morning.      metoprolol succinate (TOPROL-XL) 50 MG 24 hr tablet Take 50 mg by mouth daily. Take with or immediately following a meal.     Multiple Vitamin (MULTIVITAMIN) tablet Take 1 tablet by mouth daily. Senior MVI-Take one daily     vitamin E 400 UNIT capsule Take 400 Units by mouth daily.      No results found for this or any previous visit (from the past 48 hours). No results found.  Review of Systems  All other systems reviewed and are negative.   Height 5' 7.5" (1.715 m), weight 82.8 kg. Physical Exam  Skin: Examination patient has sausage digit swelling of the left second toe.  There is purulent drainage from the PIP joint medially.   Patient has a strong posterior tibial pulse.   White cell count 11.2     Radiographs show destructive bony changes of the DIP joint of the left second toe   Review of the MRI scan shows abscess of the second toe with osteomyelitis of the distal and middle phalanx. Assessment/Plan Assessment: Osteomyelitis and abscess left foot second toe.  Plan: Will plan for amputation left foot second toe.  Risk and benefits were discussed including risk of the wound not healing need for additional surgery.  Patient states he understands wished to proceed at this time.  Nadara Mustard, MD 09/01/2023, 6:40 AM

## 2023-09-01 NOTE — Transfer of Care (Signed)
 Immediate Anesthesia Transfer of Care Note  Patient: Dwaine Gale  Procedure(s) Performed: AMPUTATION, TOE (Left: Toe)  Patient Location: PACU  Anesthesia Type:MAC  Level of Consciousness: awake and alert   Airway & Oxygen Therapy: Patient Spontanous Breathing  Post-op Assessment: Report given to RN and Post -op Vital signs reviewed and stable  Post vital signs: Reviewed and stable  Last Vitals:  Vitals Value Taken Time  BP 144/69 09/01/23 0900  Temp    Pulse 54 09/01/23 0901  Resp 17 09/01/23 0901  SpO2 98 % 09/01/23 0901  Vitals shown include unfiled device data.  Last Pain:  Vitals:   09/01/23 0731  TempSrc:   PainSc: 4       Patients Stated Pain Goal: 3 (09/01/23 0724)  Complications: No notable events documented.

## 2023-09-02 ENCOUNTER — Encounter (HOSPITAL_COMMUNITY): Payer: Self-pay | Admitting: Orthopedic Surgery

## 2023-09-07 ENCOUNTER — Ambulatory Visit (INDEPENDENT_AMBULATORY_CARE_PROVIDER_SITE_OTHER): Admitting: Family

## 2023-09-07 ENCOUNTER — Encounter: Payer: Self-pay | Admitting: Family

## 2023-09-07 DIAGNOSIS — M869 Osteomyelitis, unspecified: Secondary | ICD-10-CM

## 2023-09-07 NOTE — Progress Notes (Signed)
   Post-Op Visit Note   Patient: Theodore Dominguez           Date of Birth: 1941-04-26           MRN: [id number] Visit Date: 09/07/2023 PCP: Camie Patience, FNP  Chief Complaint:  Chief Complaint  Patient presents with   Left Foot - Routine Post Op    09/01/23 left toe amputation    HPI:  HPI The patient is an 83 year old gentleman seen status post left second toe amputation he is full weightbearing in a postop shoe Ortho Exam On examination left foot he does have some gaping at the distal aspect of his incision this is gaped 8 mm wide there is 3 mm of depth there is no surrounding erythema scant bloody drainage  Visit Diagnoses: No diagnosis found.  Plan: Begin daily dose of cleansing.  Dry dressings.  Minimize weightbearing.  Continue postop shoe.  Follow-up in 2 weeks for suture removal.  Follow-Up Instructions: No follow-ups on file.   Imaging: No results found.  Orders:  No orders of the defined types were placed in this encounter.  No orders of the defined types were placed in this encounter.    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   Osteomyelitis of foot, acute (HCC) 03/23/2014   Past Medical History:  Diagnosis Date   Cancer (HCC)    Cataract of both eyes    immature - md just watching   HLD (hyperlipidemia)    Hypertension    Osteomyelitis (HCC) 07/2023   left foot second toe   Pre-diabetes    no meds   Prostate cancer (HCC)    2002    Family History  Problem Relation Age of Onset   Colon cancer Neg Hx     Past Surgical History:  Procedure Laterality Date   AMPUTATION Right 03/23/2014   Procedure: 1st Ray Amputation Right Foot;  Surgeon: Nadara Mustard, MD;  Location: [facility] OR;  Service: Orthopedics;  Laterality: Right;   AMPUTATION Right 12/21/2014   Procedure: Right  Transmetatarsal Amputation;  Surgeon: Nadara Mustard, MD;  Location: [facility] OR;  Service: Orthopedics;  Laterality: Right;   AMPUTATION TOE Left 09/01/2023   Procedure: AMPUTATION, TOE;  Surgeon: Nadara Mustard, MD;  Location: [facility] OR;  Service: Orthopedics;  Laterality: Left;  LEFT FOOT SECOND TOE AMPUTATION   COLONOSCOPY     cyst on head     back of head/ 2011   POLYPECTOMY     PROSTATE SURGERY     Social History   Occupational History   Not on file  Tobacco Use   Smoking status: Never   Smokeless tobacco: Never  Vaping Use   Vaping status: Never Used  Substance and Sexual Activity   Alcohol use: Yes    Comment: " a glass of wine avery once in awhile'   Drug use: No   Sexual activity: Not Currently

## 2023-09-08 ENCOUNTER — Encounter: Admitting: Family

## 2023-09-22 ENCOUNTER — Encounter: Payer: Self-pay | Admitting: Family

## 2023-09-22 ENCOUNTER — Ambulatory Visit (INDEPENDENT_AMBULATORY_CARE_PROVIDER_SITE_OTHER): Admitting: Family

## 2023-09-22 DIAGNOSIS — M869 Osteomyelitis, unspecified: Secondary | ICD-10-CM

## 2023-09-22 NOTE — Progress Notes (Signed)
   Post-Op Visit Note   Patient: Theodore Dominguez           Date of Birth: [birth year]           MRN: [id number] Visit Date: 09/22/2023 PCP: Bufford Carne, FNP  Chief Complaint:  Chief Complaint  Patient presents with   Left Foot - Routine Post Op    09/01/23 left toe amputation    HPI:  HPI The patient is an 83 year old gentleman who is seen status post left lesser toe amputation he has been in a postop shoe full weightbearing no concerns Ortho Exam On examination left foot the incision is well-healed sutures are in place there is no sign of infection  Visit Diagnoses: No diagnosis found.  Plan: Sutures harvested today without incident he will advance his weightbearing as tolerated and follow-up as needed  Follow-Up Instructions: Return if symptoms worsen or fail to improve.   Imaging: No results found.  Orders:  No orders of the defined types were placed in this encounter.  No orders of the defined types were placed in this encounter.    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   Osteomyelitis of foot, acute (HCC) 03/23/2014   Past Medical History:  Diagnosis Date   Cancer (HCC)    Cataract of both eyes    immature - md just watching   HLD (hyperlipidemia)    Hypertension    Osteomyelitis (HCC) 07/2023   left foot second toe   Pre-diabetes    no meds   Prostate cancer (HCC)    2002    Family History  Problem Relation Age of Onset   Colon cancer Neg Hx     Past Surgical History:  Procedure Laterality Date   AMPUTATION Right 03/23/2014   Procedure: 1st Ray Amputation Right Foot;  Surgeon: Timothy Ford, MD;  Location: [facility] OR;  Service: Orthopedics;  Laterality: Right;   AMPUTATION Right 12/21/2014   Procedure: Right Transmetatarsal Amputation;  Surgeon: Timothy Ford, MD;   Location: [facility] OR;  Service: Orthopedics;  Laterality: Right;   AMPUTATION TOE Left 09/01/2023   Procedure: AMPUTATION, TOE;  Surgeon: Timothy Ford, MD;  Location: [facility] OR;  Service: Orthopedics;  Laterality: Left;  LEFT FOOT SECOND TOE AMPUTATION   COLONOSCOPY     cyst on head     back of head/ 2011   POLYPECTOMY     PROSTATE SURGERY     Social History   Occupational History   Not on file  Tobacco Use   Smoking status: Never   Smokeless tobacco: Never  Vaping Use   Vaping status: Never Used  Substance and Sexual Activity   Alcohol use: Yes    Comment: " a glass of wine avery once in awhile'   Drug use: No   Sexual activity: Not Currently

## 2023-10-04 ENCOUNTER — Encounter (HOSPITAL_BASED_OUTPATIENT_CLINIC_OR_DEPARTMENT_OTHER): Admitting: Internal Medicine

## 2023-12-08 DIAGNOSIS — H52203 Unspecified astigmatism, bilateral: Secondary | ICD-10-CM | POA: Diagnosis not present

## 2023-12-08 DIAGNOSIS — H43813 Vitreous degeneration, bilateral: Secondary | ICD-10-CM | POA: Diagnosis not present

## 2023-12-08 DIAGNOSIS — H2513 Age-related nuclear cataract, bilateral: Secondary | ICD-10-CM | POA: Diagnosis not present

## 2023-12-08 DIAGNOSIS — H524 Presbyopia: Secondary | ICD-10-CM | POA: Diagnosis not present

## 2023-12-08 DIAGNOSIS — H25013 Cortical age-related cataract, bilateral: Secondary | ICD-10-CM | POA: Diagnosis not present

## 2023-12-08 DIAGNOSIS — H18593 Other hereditary corneal dystrophies, bilateral: Secondary | ICD-10-CM | POA: Diagnosis not present

## 2023-12-08 DIAGNOSIS — H5213 Myopia, bilateral: Secondary | ICD-10-CM | POA: Diagnosis not present

## 2023-12-27 DIAGNOSIS — Z961 Presence of intraocular lens: Secondary | ICD-10-CM | POA: Diagnosis not present

## 2023-12-27 DIAGNOSIS — H52221 Regular astigmatism, right eye: Secondary | ICD-10-CM | POA: Diagnosis not present

## 2023-12-27 DIAGNOSIS — H2511 Age-related nuclear cataract, right eye: Secondary | ICD-10-CM | POA: Diagnosis not present

## 2023-12-27 DIAGNOSIS — H25011 Cortical age-related cataract, right eye: Secondary | ICD-10-CM | POA: Diagnosis not present

## 2023-12-27 DIAGNOSIS — H25811 Combined forms of age-related cataract, right eye: Secondary | ICD-10-CM | POA: Diagnosis not present

## 2024-01-13 DIAGNOSIS — R7303 Prediabetes: Secondary | ICD-10-CM | POA: Diagnosis not present

## 2024-01-13 DIAGNOSIS — Z8546 Personal history of malignant neoplasm of prostate: Secondary | ICD-10-CM | POA: Diagnosis not present

## 2024-01-13 DIAGNOSIS — S98319A Complete traumatic amputation of unspecified midfoot, initial encounter: Secondary | ICD-10-CM | POA: Diagnosis not present

## 2024-01-13 DIAGNOSIS — H9193 Unspecified hearing loss, bilateral: Secondary | ICD-10-CM | POA: Diagnosis not present

## 2024-01-13 DIAGNOSIS — E782 Mixed hyperlipidemia: Secondary | ICD-10-CM | POA: Diagnosis not present

## 2024-01-13 DIAGNOSIS — S98132A Complete traumatic amputation of one left lesser toe, initial encounter: Secondary | ICD-10-CM | POA: Diagnosis not present

## 2024-01-13 DIAGNOSIS — I10 Essential (primary) hypertension: Secondary | ICD-10-CM | POA: Diagnosis not present

## 2024-01-13 DIAGNOSIS — Z0000 Encounter for general adult medical examination without abnormal findings: Secondary | ICD-10-CM | POA: Diagnosis not present

## 2024-01-24 DIAGNOSIS — H25812 Combined forms of age-related cataract, left eye: Secondary | ICD-10-CM | POA: Diagnosis not present

## 2024-01-24 DIAGNOSIS — H52222 Regular astigmatism, left eye: Secondary | ICD-10-CM | POA: Diagnosis not present

## 2024-01-24 DIAGNOSIS — H2512 Age-related nuclear cataract, left eye: Secondary | ICD-10-CM | POA: Diagnosis not present

## 2024-01-24 DIAGNOSIS — Z961 Presence of intraocular lens: Secondary | ICD-10-CM | POA: Diagnosis not present

## 2024-01-24 DIAGNOSIS — H25012 Cortical age-related cataract, left eye: Secondary | ICD-10-CM | POA: Diagnosis not present

## 2024-03-06 DIAGNOSIS — B078 Other viral warts: Secondary | ICD-10-CM | POA: Diagnosis not present

## 2024-03-06 DIAGNOSIS — L400 Psoriasis vulgaris: Secondary | ICD-10-CM | POA: Diagnosis not present

## 2024-03-07 DIAGNOSIS — Z23 Encounter for immunization: Secondary | ICD-10-CM | POA: Diagnosis not present

## 2024-05-10 ENCOUNTER — Ambulatory Visit (HOSPITAL_COMMUNITY)
Admission: RE | Admit: 2024-05-10 | Discharge: 2024-05-10 | Disposition: A | Discharge status: Home / Self Care | Source: Ambulatory Visit | Attending: Internal Medicine | Admitting: Internal Medicine

## 2024-05-10 ENCOUNTER — Encounter (HOSPITAL_BASED_OUTPATIENT_CLINIC_OR_DEPARTMENT_OTHER): Attending: Internal Medicine | Admitting: Internal Medicine

## 2024-05-10 ENCOUNTER — Other Ambulatory Visit (HOSPITAL_COMMUNITY): Payer: Self-pay | Admitting: Internal Medicine

## 2024-05-10 DIAGNOSIS — S91302A Unspecified open wound, left foot, initial encounter: Secondary | ICD-10-CM | POA: Insufficient documentation

## 2024-05-10 DIAGNOSIS — M7989 Other specified soft tissue disorders: Secondary | ICD-10-CM | POA: Diagnosis not present

## 2024-05-10 DIAGNOSIS — M21612 Bunion of left foot: Secondary | ICD-10-CM | POA: Insufficient documentation

## 2024-05-10 DIAGNOSIS — X58XXXA Exposure to other specified factors, initial encounter: Secondary | ICD-10-CM | POA: Insufficient documentation

## 2024-05-10 DIAGNOSIS — Z89431 Acquired absence of right foot: Secondary | ICD-10-CM | POA: Insufficient documentation

## 2024-05-10 DIAGNOSIS — L89894 Pressure ulcer of other site, stage 4: Secondary | ICD-10-CM | POA: Diagnosis not present

## 2024-05-10 DIAGNOSIS — M19072 Primary osteoarthritis, left ankle and foot: Secondary | ICD-10-CM | POA: Diagnosis not present

## 2024-05-10 DIAGNOSIS — M869 Osteomyelitis, unspecified: Secondary | ICD-10-CM | POA: Diagnosis not present

## 2024-05-10 DIAGNOSIS — Z89422 Acquired absence of other left toe(s): Secondary | ICD-10-CM | POA: Diagnosis not present

## 2024-05-10 MED ORDER — REGADENOSON 0.4 MG/5ML IV SOLN
INTRAVENOUS | Status: AC
  Filled 2024-05-10: qty 5

## 2024-05-17 ENCOUNTER — Encounter (HOSPITAL_BASED_OUTPATIENT_CLINIC_OR_DEPARTMENT_OTHER): Admitting: Internal Medicine

## 2024-05-17 DIAGNOSIS — L89894 Pressure ulcer of other site, stage 4: Secondary | ICD-10-CM | POA: Diagnosis not present

## 2024-05-17 DIAGNOSIS — S91302A Unspecified open wound, left foot, initial encounter: Secondary | ICD-10-CM

## 2024-05-17 DIAGNOSIS — M21612 Bunion of left foot: Secondary | ICD-10-CM

## 2024-05-19 ENCOUNTER — Other Ambulatory Visit (HOSPITAL_COMMUNITY): Payer: Self-pay | Admitting: Internal Medicine

## 2024-05-19 DIAGNOSIS — L89894 Pressure ulcer of other site, stage 4: Secondary | ICD-10-CM

## 2024-05-25 ENCOUNTER — Encounter (HOSPITAL_COMMUNITY): Payer: Self-pay

## 2024-05-26 ENCOUNTER — Ambulatory Visit (HOSPITAL_COMMUNITY)
Admission: RE | Admit: 2024-05-26 | Discharge: 2024-05-26 | Disposition: A | Discharge status: Home / Self Care | Source: Ambulatory Visit | Attending: Internal Medicine | Admitting: Internal Medicine

## 2024-05-26 DIAGNOSIS — L89894 Pressure ulcer of other site, stage 4: Secondary | ICD-10-CM | POA: Insufficient documentation

## 2024-05-31 ENCOUNTER — Encounter (HOSPITAL_BASED_OUTPATIENT_CLINIC_OR_DEPARTMENT_OTHER): Admitting: Internal Medicine

## 2024-05-31 DIAGNOSIS — L89894 Pressure ulcer of other site, stage 4: Secondary | ICD-10-CM | POA: Diagnosis not present

## 2024-05-31 DIAGNOSIS — S91302A Unspecified open wound, left foot, initial encounter: Secondary | ICD-10-CM

## 2024-05-31 DIAGNOSIS — M21612 Bunion of left foot: Secondary | ICD-10-CM

## 2024-06-07 ENCOUNTER — Encounter (HOSPITAL_BASED_OUTPATIENT_CLINIC_OR_DEPARTMENT_OTHER): Attending: Internal Medicine | Admitting: Internal Medicine

## 2024-06-07 DIAGNOSIS — L89894 Pressure ulcer of other site, stage 4: Secondary | ICD-10-CM | POA: Insufficient documentation

## 2024-06-07 DIAGNOSIS — M21612 Bunion of left foot: Secondary | ICD-10-CM | POA: Insufficient documentation

## 2024-06-07 DIAGNOSIS — Z89431 Acquired absence of right foot: Secondary | ICD-10-CM | POA: Insufficient documentation

## 2024-06-07 DIAGNOSIS — S91302A Unspecified open wound, left foot, initial encounter: Secondary | ICD-10-CM | POA: Insufficient documentation

## 2024-06-07 DIAGNOSIS — X58XXXA Exposure to other specified factors, initial encounter: Secondary | ICD-10-CM | POA: Insufficient documentation

## 2024-06-14 ENCOUNTER — Encounter (HOSPITAL_BASED_OUTPATIENT_CLINIC_OR_DEPARTMENT_OTHER): Admitting: Internal Medicine

## 2024-06-14 DIAGNOSIS — Z89431 Acquired absence of right foot: Secondary | ICD-10-CM | POA: Diagnosis not present

## 2024-06-14 DIAGNOSIS — X58XXXA Exposure to other specified factors, initial encounter: Secondary | ICD-10-CM | POA: Diagnosis not present

## 2024-06-14 DIAGNOSIS — M21612 Bunion of left foot: Secondary | ICD-10-CM | POA: Diagnosis not present

## 2024-06-14 DIAGNOSIS — S91302A Unspecified open wound, left foot, initial encounter: Secondary | ICD-10-CM

## 2024-06-14 DIAGNOSIS — L89894 Pressure ulcer of other site, stage 4: Secondary | ICD-10-CM

## 2024-06-21 ENCOUNTER — Encounter (HOSPITAL_BASED_OUTPATIENT_CLINIC_OR_DEPARTMENT_OTHER): Admitting: Internal Medicine

## 2024-06-21 DIAGNOSIS — M21612 Bunion of left foot: Secondary | ICD-10-CM

## 2024-06-21 DIAGNOSIS — L89894 Pressure ulcer of other site, stage 4: Secondary | ICD-10-CM | POA: Diagnosis not present

## 2024-06-21 DIAGNOSIS — S91302A Unspecified open wound, left foot, initial encounter: Secondary | ICD-10-CM | POA: Diagnosis not present

## 2024-06-22 ENCOUNTER — Encounter: Payer: Self-pay | Admitting: Podiatry

## 2024-06-22 ENCOUNTER — Ambulatory Visit: Admitting: Podiatry

## 2024-06-22 ENCOUNTER — Ambulatory Visit

## 2024-06-22 VITALS — Ht 67.5 in | Wt 182.0 lb

## 2024-06-22 DIAGNOSIS — M86179 Other acute osteomyelitis, unspecified ankle and foot: Secondary | ICD-10-CM | POA: Diagnosis not present

## 2024-06-22 DIAGNOSIS — L97522 Non-pressure chronic ulcer of other part of left foot with fat layer exposed: Secondary | ICD-10-CM

## 2024-06-22 DIAGNOSIS — L97524 Non-pressure chronic ulcer of other part of left foot with necrosis of bone: Secondary | ICD-10-CM | POA: Diagnosis not present

## 2024-06-23 ENCOUNTER — Other Ambulatory Visit: Payer: Self-pay | Admitting: Podiatry

## 2024-06-24 LAB — BASIC METABOLIC PANEL WITH GFR
BUN: 19 mg/dL (ref 7–25)
CO2: 26 mmol/L (ref 20–32)
Calcium: 8.8 mg/dL (ref 8.6–10.3)
Chloride: 103 mmol/L (ref 98–110)
Creat: 1.02 mg/dL (ref 0.70–1.22)
Glucose, Bld: 125 mg/dL (ref 65–139)
Potassium: 3.9 mmol/L (ref 3.5–5.3)
Sodium: 139 mmol/L (ref 135–146)
eGFR: 72 mL/min/{1.73_m2}

## 2024-06-24 LAB — CBC WITH DIFFERENTIAL/PLATELET
Absolute Lymphocytes: 1744 {cells}/uL (ref 850–3900)
Absolute Monocytes: 1215 {cells}/uL — ABNORMAL HIGH (ref 200–950)
Basophils Absolute: 39 {cells}/uL (ref 0–200)
Basophils Relative: 0.4 %
Eosinophils Absolute: 490 {cells}/uL (ref 15–500)
Eosinophils Relative: 5 %
HCT: 42.2 % (ref 39.4–51.1)
Hemoglobin: 14.3 g/dL (ref 13.2–17.1)
MCH: 30.5 pg (ref 27.0–33.0)
MCHC: 33.9 g/dL (ref 31.6–35.4)
MCV: 90 fL (ref 81.4–101.7)
MPV: 9.8 fL (ref 7.5–12.5)
Monocytes Relative: 12.4 %
Neutro Abs: 6311 {cells}/uL (ref 1500–7800)
Neutrophils Relative %: 64.4 %
Platelets: 206 10*3/uL (ref 140–400)
RBC: 4.69 Million/uL (ref 4.20–5.80)
RDW: 13 % (ref 11.0–15.0)
Total Lymphocyte: 17.8 %
WBC: 9.8 10*3/uL (ref 3.8–10.8)

## 2024-06-24 LAB — C-REACTIVE PROTEIN: CRP: 61.9 mg/L — ABNORMAL HIGH

## 2024-06-24 LAB — SEDIMENTATION RATE: Sed Rate: 6 mm/h (ref 0–20)

## 2024-06-24 NOTE — Progress Notes (Signed)
"  °  Subjective:  Patient ID: Theodore Dominguez, male    DOB: 1940/12/15,  MRN: [id number]  Chief Complaint  Patient presents with   Wound Check    Pt is here discuss wound on the left foot, states his MRI was positive for osteomyelitis stating he was sent here to get bone biopsy.    Discussed the use of AI scribe software for clinical note transcription with the patient, who gave verbal consent to proceed.  History of Present Illness Theodore Dominguez is an 84 year old male with chronic non-healing left foot ulcer and prior left second toe amputation who presents for evaluation of possible osteomyelitis.  He has had a left foot ulcer for 4-5 weeks with slow healing and intermittent drainage. He denies pain, swelling, fevers, or chills. He uses topical antibiotic ointment only and is not on systemic antibiotics. He is not diabetic and was previously told he has good foot circulation. He notes occasional numbness in the foot without significant sensory loss.  He had a left second toe amputation in April of last year. He has not had recent vascular studies or infection-related labs.  MRI about one month ago suggested possible deep infection toward bone, discussed at the wound care center. Bone biopsy was recommended but has not yet been done.      Objective:  There were no vitals filed for this visit.  Physical Exam General: AAO x3, NAD  Dermatological: As pictured below there is a full-thickness ulceration noted along the medial aspect of the first MPJ on the area of the bunion.  The wound does probe close to bone.  There is macerated tissue with localized erythema around the wound but there is no ascending cellulitis.  There is no drainage or pus today.  There is no increase in temperature to the foot.  Vascular: Dorsalis Pedis artery and Posterior Tibial artery pedal pulses are 2/4 bilateral with immedate capillary fill time. There is no pain with calf compression, swelling, warmth, erythema.    Neruologic: Sensation decreased.  Musculoskeletal: Significant bunion is present.  Previous second toe potation.         Results Radiology Left foot MRI (05/2024): Findings consistent with osteomyelitis of bone underlying chronic ulcer Left foot X-ray (06/22/2024): Significant bunion is present.  Chronic changes are present.  Concern for possible early osteomyelitis.   Assessment:   1. 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 Osteomyelitis of foot, acute (HCC)      Plan:  Patient was evaluated and treated and all questions answered.  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 X-ray showed no significant progression. Bone infection extent uncertain. Bone biopsy needed for confirmation and management. - We discussed with conservative as well as surgical options.  We had a discussion regards to the treatment options. - Planned bone biopsy next Wednesday (in office) to identify organism and guide antibiotics. - Discussed bone biopsy risks: infection, amputation, long-term antibiotics with nephrotoxicity. - Applied antibiotic ointment to wound and bandage - Ordered blood work for infection markers, provided location and walk-in instructions. - Scheduled follow-up for bone biopsy next Wednesday - Discussed possible amputation if infection confirmed and unresponsive to antibiotics. Aim to clarify diagnosis and preserve foot structure.    No follow-ups on file.   Donnice JONELLE Fees DPM  "

## 2024-06-28 ENCOUNTER — Ambulatory Visit

## 2024-06-28 ENCOUNTER — Ambulatory Visit (INDEPENDENT_AMBULATORY_CARE_PROVIDER_SITE_OTHER): Admitting: Podiatry

## 2024-06-28 ENCOUNTER — Encounter (HOSPITAL_BASED_OUTPATIENT_CLINIC_OR_DEPARTMENT_OTHER): Admitting: Internal Medicine

## 2024-06-28 VITALS — BP 136/63 | Temp 98.1°F

## 2024-06-28 DIAGNOSIS — M898X9 Other specified disorders of bone, unspecified site: Secondary | ICD-10-CM

## 2024-06-28 DIAGNOSIS — S91302A Unspecified open wound, left foot, initial encounter: Secondary | ICD-10-CM

## 2024-06-28 DIAGNOSIS — M86179 Other acute osteomyelitis, unspecified ankle and foot: Secondary | ICD-10-CM

## 2024-06-28 DIAGNOSIS — M21612 Bunion of left foot: Secondary | ICD-10-CM

## 2024-06-28 DIAGNOSIS — M86172 Other acute osteomyelitis, left ankle and foot: Secondary | ICD-10-CM

## 2024-06-28 DIAGNOSIS — L89894 Pressure ulcer of other site, stage 4: Secondary | ICD-10-CM | POA: Diagnosis not present

## 2024-06-28 MED ORDER — AMOXICILLIN-POT CLAVULANATE 875-125 MG PO TABS: 1.0000 | ORAL_TABLET | Freq: Two times a day (BID) | ORAL | 0 refills | Status: DC

## 2024-06-28 NOTE — Patient Instructions (Signed)
 Keep bandage intact for 2 days. On Friday, you can remove it and start to change the bandage daily. I would keep a small amount of betadine or antibiotic ointment on the wound. Change daily. Wear surgical shoe. Elevate foot and limit weightbearing.  Monitor for any signs/symptoms of infection. Call the office immediately if any occur or go directly to the emergency room. Call with any questions/concerns.

## 2024-06-29 ENCOUNTER — Other Ambulatory Visit: Payer: Self-pay

## 2024-06-29 ENCOUNTER — Telehealth: Payer: Self-pay

## 2024-06-29 ENCOUNTER — Other Ambulatory Visit: Payer: Self-pay | Admitting: Lab

## 2024-06-29 ENCOUNTER — Other Ambulatory Visit (HOSPITAL_COMMUNITY)
Admission: RE | Admit: 2024-06-29 | Discharge: 2024-06-29 | Disposition: A | Discharge status: Home / Self Care | Attending: Podiatry | Admitting: Podiatry

## 2024-06-29 ENCOUNTER — Other Ambulatory Visit (HOSPITAL_COMMUNITY)
Admission: RE | Admit: 2024-06-29 | Discharge: 2024-06-29 | Disposition: A | Source: Ambulatory Visit | Attending: Podiatry | Admitting: Podiatry

## 2024-06-29 DIAGNOSIS — M86179 Other acute osteomyelitis, unspecified ankle and foot: Secondary | ICD-10-CM | POA: Insufficient documentation

## 2024-06-29 NOTE — Telephone Encounter (Signed)
 Spoke to [place] order is being processed.

## 2024-06-29 NOTE — Telephone Encounter (Signed)
 Patient called this morning. He had to change the bandage during the night due to bleeding - through the ace wrap and very wet - today it is better, just a small amount of visible bleed through. Discussed instructions for daily changes with Neosporin or betadine.  Patient is asking if he can get the foot wet, take a shower. [phone]

## 2024-06-29 NOTE — Telephone Encounter (Signed)
 Received a call from [place] in [facility] pathology. She said there is something weird with the bone biopsy order, that it doesn't look finalized or complete on her end. She asks that we update or re-enter the order.   Did the specimen go to [facility]? Or to [facility]? ([facility] pathology lab is the default resulting agency with pathology orders - has to be manually changed in the order if it goes somewhere else)   Christy's direct number is [phone]

## 2024-06-30 ENCOUNTER — Ambulatory Visit: Payer: Self-pay | Admitting: Podiatry

## 2024-07-01 NOTE — Progress Notes (Signed)
"  °  Subjective:  Patient ID: Theodore Dominguez, male    DOB: 1941-05-04,  MRN: [id number] No chief complaint on file.   History of Present Illness Theodore Dominguez is an 84 year old male with chronic non-healing left foot ulcer and prior left second toe amputation who presents for bone biopsy.  Previous history: He has had a left foot ulcer for 4-5 weeks with slow healing and intermittent drainage. He denies pain, swelling, fevers, or chills. He uses topical antibiotic ointment only and is not on systemic antibiotics. He is not diabetic and was previously told he has good foot circulation. He notes occasional numbness in the foot without significant sensory loss.  He had a left second toe amputation in April of last year. He has not had recent vascular studies or infection-related labs.  MRI about one month ago suggested possible deep infection toward bone, discussed at the wound care center. Bone biopsy was recommended but has not yet been done.      Objective:  There were no vitals filed for this visit.  Physical Exam General: AAO x3, NAD  Dermatological: As pictured below there is a full-thickness ulceration noted along the medial aspect of the first MPJ on the area of the bunion.  The wound does probe close to bone.  There is macerated tissue with localized erythema around the wound but there is no ascending cellulitis.  There is no drainage or pus today.  There is no increase in temperature to the foot.  Unchanged.  Vascular: Dorsalis Pedis artery and Posterior Tibial artery pedal pulses are 2/4 bilateral with immedate capillary fill time. There is no pain with calf compression, swelling, warmth, erythema.   Neruologic: Sensation decreased.  Musculoskeletal: Significant bunion is present.  Previous second toe potation.      Assessment:   1. 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 Osteomyelitis of foot, acute (HCC)      Plan:  Patient was evaluated and treated and all  questions answered.  Assessment and Plan Assessment & Plan Chronic left foot ulcer with possible osteomyelitis Patient presents today for bone biopsy of the left foot for concern of osteomyelitis.  We again discussed the procedure as well as postoperative course.  Alternatives, risks, complications were discussed.  No promises or guarantees were given-.  Discussed that he is still at risk of amputation. Consent was signed.  And cleaned alcohol.  A mixture of 3 mL of lidocaine , Marcaine  plain was infiltrated in a regional block fashion.  The left lower extremities and scrubbed, prepped, draped in normal sterile fashion.  At this time a small stab incision was then made just proximal to the area of the ulcer.  Jamshidi needle was then introduced into the metatarsal and a piece of bone was removedand pathology as well as microbiology.  There is no purulence.  I irrigated the wound with saline hemostasis achieved.  The incision was closed with nylon.  Xeroform was applied followed by dressing.  He tolerated the procedure well any complications.  Remain surgical shoe.  Postprocedure instructions discussed.  Discussed in 2 days he can remove the bandage and start to wash the foot with soap and water, dry thoroughly apply similar bandage. -Postoperative x-rays were obtained and reviewed.  Status post bone biopsy.  No complicating factors.  Bunion present.  Previous second toe amputation.  No follow-ups on file.  Donnice JONELLE Fees DPM  "

## 2024-07-03 LAB — SURGICAL PATHOLOGY

## 2024-07-04 LAB — AEROBIC/ANAEROBIC CULTURE W GRAM STAIN (SURGICAL/DEEP WOUND)
Culture: NO GROWTH
Gram Stain: NONE SEEN

## 2024-07-05 ENCOUNTER — Encounter (HOSPITAL_BASED_OUTPATIENT_CLINIC_OR_DEPARTMENT_OTHER): Admitting: Internal Medicine

## 2024-07-06 ENCOUNTER — Ambulatory Visit: Admitting: Podiatry

## 2024-07-06 DIAGNOSIS — M86179 Other acute osteomyelitis, unspecified ankle and foot: Secondary | ICD-10-CM

## 2024-07-06 MED ORDER — AMOXICILLIN-POT CLAVULANATE 875-125 MG PO TABS: 1.0000 | ORAL_TABLET | Freq: Two times a day (BID) | ORAL | 0 refills | Status: AC

## 2024-07-06 NOTE — Patient Instructions (Signed)
 Monitor for any signs/symptoms of infection. Call the office immediately if any occur or go directly to the emergency room. Call with any questions/concerns.

## 2024-07-07 ENCOUNTER — Ambulatory Visit: Payer: Self-pay | Admitting: Podiatry

## 2024-07-10 ENCOUNTER — Ambulatory Visit: Payer: Self-pay | Admitting: Infectious Disease

## 2024-07-12 ENCOUNTER — Encounter (HOSPITAL_BASED_OUTPATIENT_CLINIC_OR_DEPARTMENT_OTHER): Admitting: Internal Medicine

## 2024-07-31 ENCOUNTER — Ambulatory Visit: Admitting: Podiatry

## 2025-02-15 ENCOUNTER — Encounter (HOSPITAL_BASED_OUTPATIENT_CLINIC_OR_DEPARTMENT_OTHER): Admitting: Internal Medicine
